# Patient Record
Sex: Female | Born: 1985 | Race: Black or African American | Hispanic: No | Marital: Married | Smoking: Never smoker
Health system: Southern US, Community
[De-identification: ages and names within clinical notes are randomized; demographics above are authoritative.]

## PROBLEM LIST (undated history)

## (undated) DIAGNOSIS — IMO0002 Reserved for concepts with insufficient information to code with codable children: Secondary | ICD-10-CM

## (undated) DIAGNOSIS — E119 Type 2 diabetes mellitus without complications: Secondary | ICD-10-CM

## (undated) DIAGNOSIS — Z9889 Other specified postprocedural states: Secondary | ICD-10-CM

## (undated) DIAGNOSIS — D649 Anemia, unspecified: Secondary | ICD-10-CM

## (undated) DIAGNOSIS — O24419 Gestational diabetes mellitus in pregnancy, unspecified control: Secondary | ICD-10-CM

## (undated) DIAGNOSIS — B999 Unspecified infectious disease: Secondary | ICD-10-CM

## (undated) DIAGNOSIS — R87619 Unspecified abnormal cytological findings in specimens from cervix uteri: Secondary | ICD-10-CM

## (undated) DIAGNOSIS — R112 Nausea with vomiting, unspecified: Secondary | ICD-10-CM

## (undated) DIAGNOSIS — R51 Headache: Secondary | ICD-10-CM

## (undated) DIAGNOSIS — S62109A Fracture of unspecified carpal bone, unspecified wrist, initial encounter for closed fracture: Secondary | ICD-10-CM

## (undated) DIAGNOSIS — S42309A Unspecified fracture of shaft of humerus, unspecified arm, initial encounter for closed fracture: Secondary | ICD-10-CM

## (undated) DIAGNOSIS — N63 Unspecified lump in unspecified breast: Secondary | ICD-10-CM

## (undated) HISTORY — PX: ABLATION: SHX5711

## (undated) HISTORY — DX: Other specified postprocedural states: Z98.890

## (undated) HISTORY — DX: Nausea with vomiting, unspecified: R11.2

## (undated) HISTORY — DX: Fracture of unspecified carpal bone, unspecified wrist, initial encounter for closed fracture: S62.109A

## (undated) HISTORY — DX: Gestational diabetes mellitus in pregnancy, unspecified control: O24.419

## (undated) HISTORY — DX: Unspecified infectious disease: B99.9

## (undated) HISTORY — DX: Unspecified fracture of shaft of humerus, unspecified arm, initial encounter for closed fracture: S42.309A

## (undated) HISTORY — DX: Anemia, unspecified: D64.9

## (undated) HISTORY — DX: Type 2 diabetes mellitus without complications: E11.9

## (undated) HISTORY — DX: Headache: R51

## (undated) HISTORY — DX: Reserved for concepts with insufficient information to code with codable children: IMO0002

## (undated) HISTORY — DX: Unspecified abnormal cytological findings in specimens from cervix uteri: R87.619

---

## 2005-04-02 ENCOUNTER — Emergency Department (HOSPITAL_COMMUNITY): Admission: EM | Admit: 2005-04-02 | Discharge: 2005-04-03 | Payer: Self-pay | Admitting: Emergency Medicine

## 2005-09-09 ENCOUNTER — Inpatient Hospital Stay (HOSPITAL_COMMUNITY): Admission: AD | Admit: 2005-09-09 | Discharge: 2005-09-09 | Payer: Self-pay | Admitting: Obstetrics and Gynecology

## 2005-10-16 ENCOUNTER — Inpatient Hospital Stay (HOSPITAL_COMMUNITY): Admission: AD | Admit: 2005-10-16 | Discharge: 2005-10-16 | Payer: Self-pay | Admitting: Obstetrics & Gynecology

## 2005-10-17 ENCOUNTER — Inpatient Hospital Stay (HOSPITAL_COMMUNITY): Admission: AD | Admit: 2005-10-17 | Discharge: 2005-10-19 | Payer: Self-pay | Admitting: Obstetrics & Gynecology

## 2006-12-26 ENCOUNTER — Inpatient Hospital Stay (HOSPITAL_COMMUNITY): Admission: AD | Admit: 2006-12-26 | Discharge: 2006-12-26 | Payer: Self-pay | Admitting: Obstetrics & Gynecology

## 2007-01-06 ENCOUNTER — Inpatient Hospital Stay (HOSPITAL_COMMUNITY): Admission: AD | Admit: 2007-01-06 | Discharge: 2007-01-06 | Payer: Self-pay | Admitting: Obstetrics and Gynecology

## 2007-01-08 ENCOUNTER — Inpatient Hospital Stay (HOSPITAL_COMMUNITY): Admission: AD | Admit: 2007-01-08 | Discharge: 2007-01-09 | Payer: Self-pay | Admitting: Obstetrics and Gynecology

## 2007-01-16 ENCOUNTER — Inpatient Hospital Stay (HOSPITAL_COMMUNITY): Admission: AD | Admit: 2007-01-16 | Discharge: 2007-01-18 | Payer: Self-pay | Admitting: Obstetrics and Gynecology

## 2007-10-11 ENCOUNTER — Emergency Department (HOSPITAL_COMMUNITY): Admission: EM | Admit: 2007-10-11 | Discharge: 2007-10-12 | Payer: Self-pay | Admitting: Emergency Medicine

## 2009-01-24 ENCOUNTER — Encounter: Admission: RE | Admit: 2009-01-24 | Discharge: 2009-01-24 | Payer: Self-pay | Admitting: Emergency Medicine

## 2009-04-26 HISTORY — PX: DILATION AND CURETTAGE OF UTERUS: SHX78

## 2009-08-10 IMAGING — CT CT PELVIS W/ CM
2 of 5 series · 17 of 46 positions shown, 19 images · IV contrast (agent unspecified)
Comparison: None available.

CT ABDOMEN

CLINICAL DATA: Right side abdominal pain and tenderness.

CT ABDOMEN AND PELVIS WITH CONTRAST
TECHNIQUE: Multidetector CT imaging of the abdomen and pelvis was
performed using the standard protocol following bolus
administration of intravenous contrast.
Contrast: 100 ml Kmnipaque-PVV.

[Series 2: abd_pel 5.0 b40f st · axial · 0.63mm/px · z∈[-424,-4]mm · 14 of 94 slices shown, 16 images]
[im 5/94  soft-tissue]
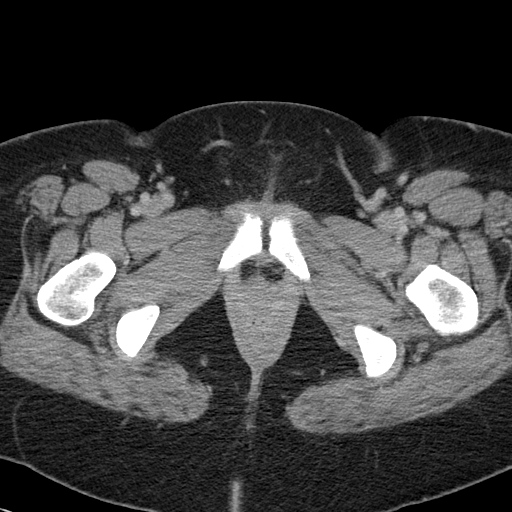
[im 5/94  bone]
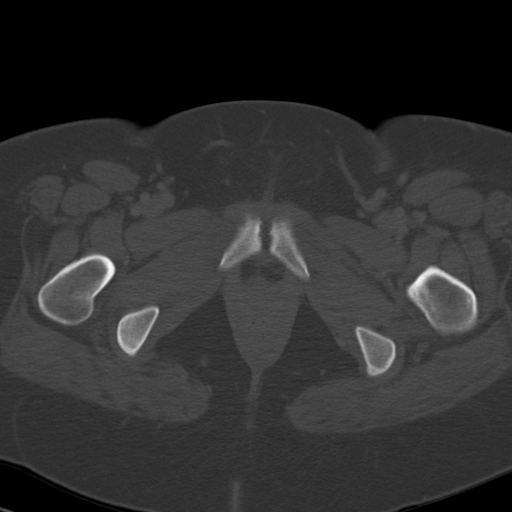
[im 10/94  soft-tissue]
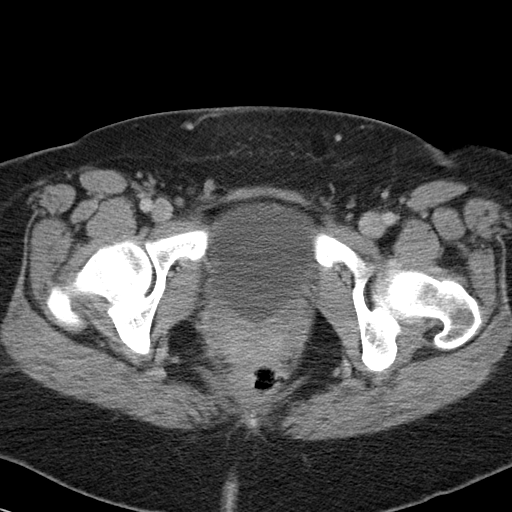
[im 20/94  soft-tissue]
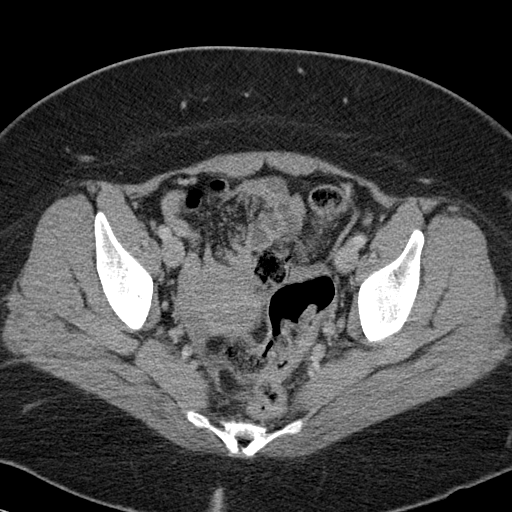
[im 25/94  soft-tissue]
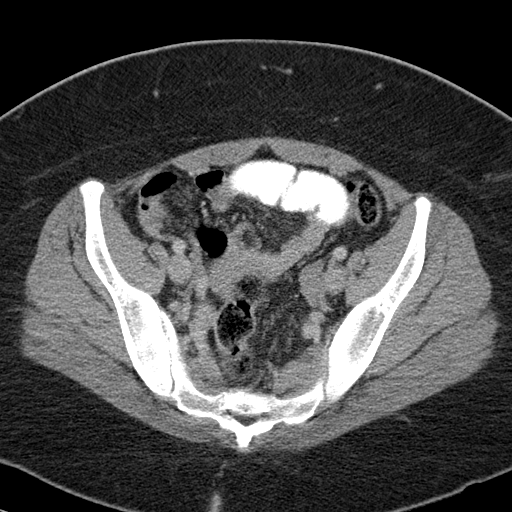
[im 30/94  soft-tissue]
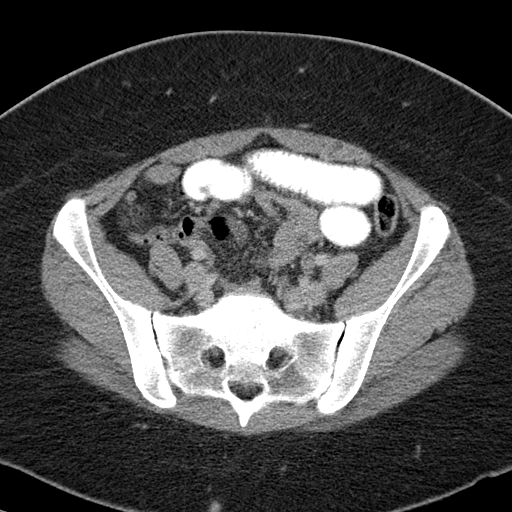
[im 40/94  soft-tissue]
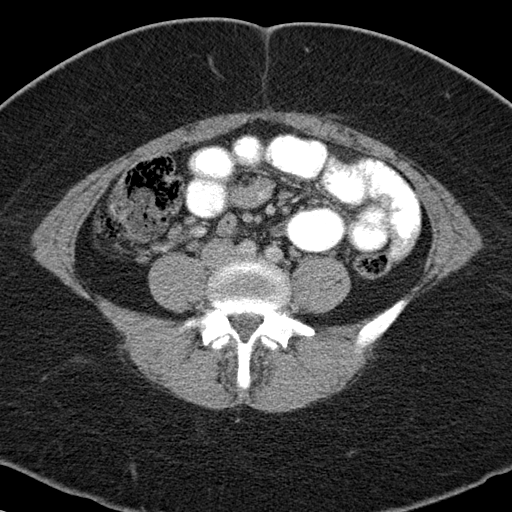
[im 45/94  soft-tissue]
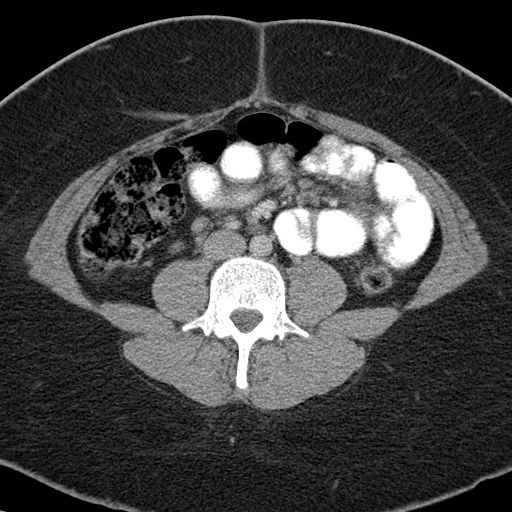
[im 49/94  soft-tissue]
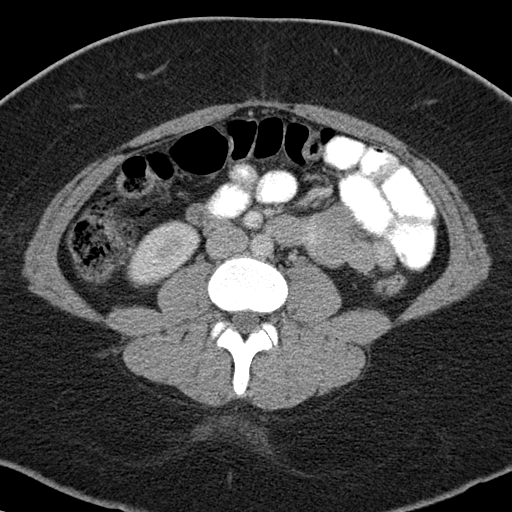
[im 54/94  soft-tissue]
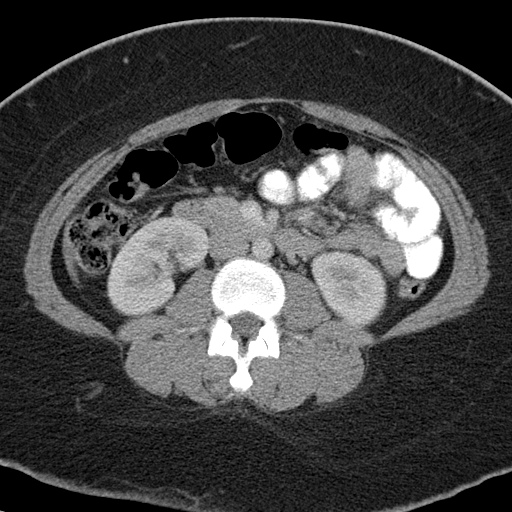
[im 54/94  bone]
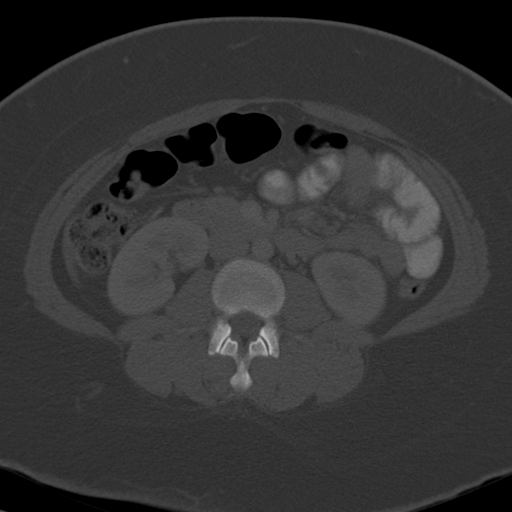
[im 64/94  soft-tissue]
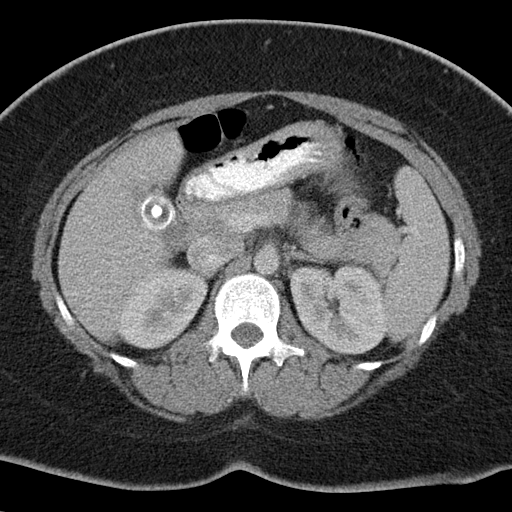
[im 69/94  soft-tissue]
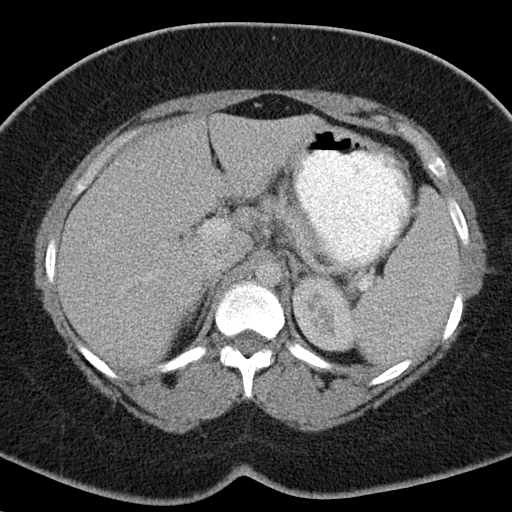
[im 74/94  soft-tissue]
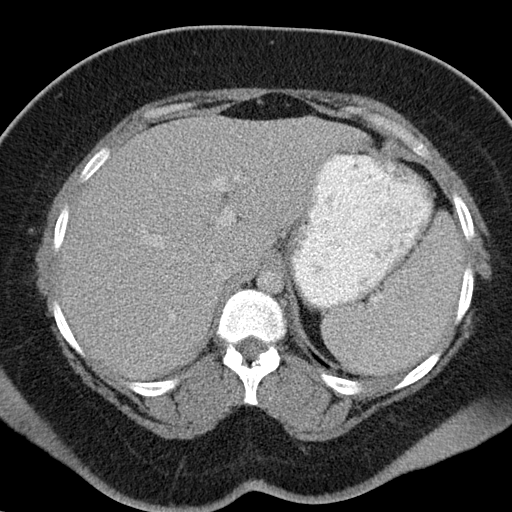
[im 84/94  soft-tissue]
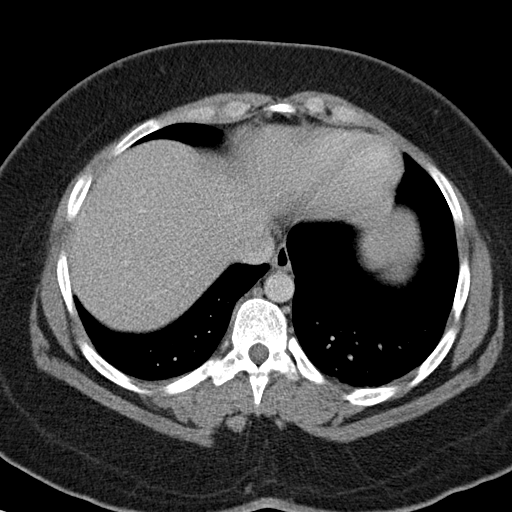
[im 89/94  soft-tissue]
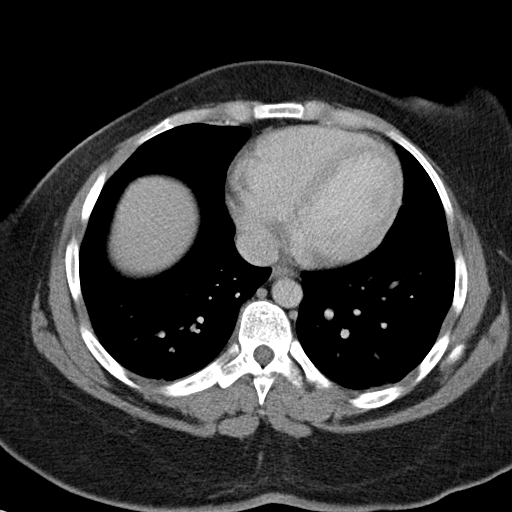

[Series 602: <mpr thick range> · coronal · 0.96mm/px · 3 of 71 slices shown]
[im 24/71  soft-tissue]
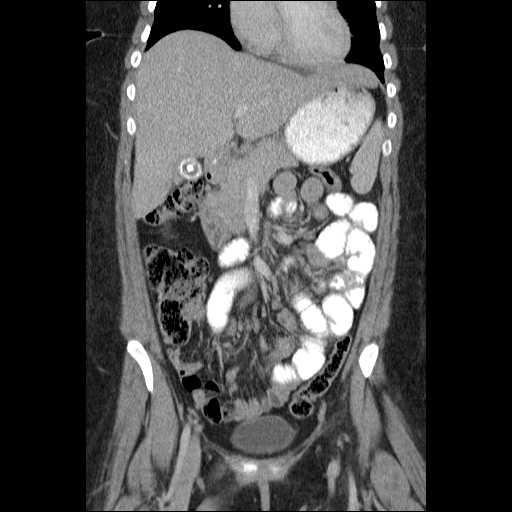
[im 32/71  soft-tissue]
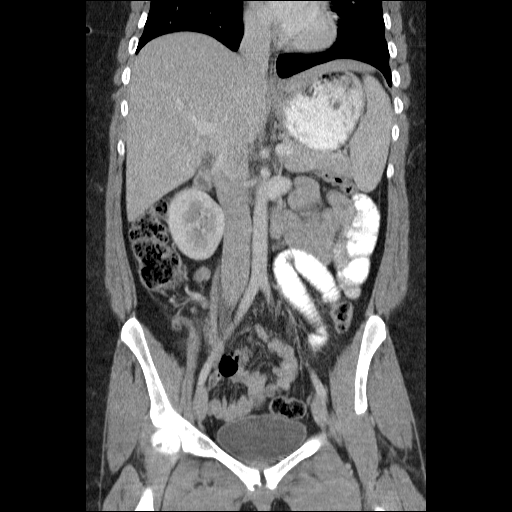
[im 39/71  soft-tissue]
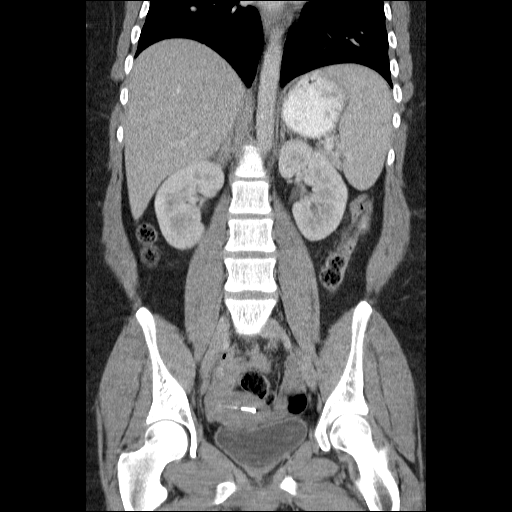

[17 of 46 positions shown; findings below may reference images not displayed]

FINDINGS: There is some mild dependent atelectatic change in the
lung bases.  No pleural or pericardial effusion.

Gallstones are noted with a large stone which measures approximate
2 cm in diameter seen.  Gallbladder is contracted.  There is no
pericholecystic fluid.  No intra or extrahepatic biliary ductal
dilatation is identified.  The liver, spleen, adrenal glands,
pancreas and kidneys all appear normal.  No abdominal
lymphadenopathy or fluid collection.  Stomach and small bowel
appear normal.  No focal bony abnormality.
IMPRESSION: 1.  No acute finding
2.  Gallstones.

CT PELVIS
FINDINGS: The appendix is visualized appears normal.  Small amount
of pelvic fluid likely represents physiologic change.  IUD is in
place and appears unremarkable.  Adnexa are unremarkable.  The
colon appears normal.  No lymphadenopathy.  Urinary bladder appears
normal.  No focal bony abnormality.
IMPRESSION: No acute finding in the pelvis.  Specifically, negative for
appendicitis.

## 2010-01-30 ENCOUNTER — Ambulatory Visit (HOSPITAL_COMMUNITY): Admission: RE | Admit: 2010-01-30 | Discharge: 2010-01-30 | Payer: Self-pay | Admitting: Obstetrics and Gynecology

## 2010-02-24 DEATH — deceased

## 2010-07-09 LAB — CBC
Hemoglobin: 12.5 g/dL (ref 12.0–15.0)
Platelets: 306 10*3/uL (ref 150–400)
RBC: 4.66 MIL/uL (ref 3.87–5.11)
WBC: 7.3 10*3/uL (ref 4.0–10.5)

## 2010-07-09 LAB — TYPE AND SCREEN
ABO/RH(D): A POS
Antibody Screen: NEGATIVE

## 2010-07-09 LAB — ABO/RH: ABO/RH(D): A POS

## 2011-01-21 LAB — WET PREP, GENITAL: Clue Cells Wet Prep HPF POC: NONE SEEN

## 2011-01-21 LAB — POCT I-STAT, CHEM 8
BUN: 11
Calcium, Ion: 1.18
Chloride: 107
Creatinine, Ser: 0.6
Glucose, Bld: 114 — ABNORMAL HIGH
HCT: 41
Hemoglobin: 13.9
Potassium: 3.8
Sodium: 138
TCO2: 20

## 2011-01-21 LAB — URINALYSIS, ROUTINE W REFLEX MICROSCOPIC
Bilirubin Urine: NEGATIVE
Ketones, ur: NEGATIVE
Nitrite: NEGATIVE
Protein, ur: NEGATIVE
Urobilinogen, UA: 1

## 2011-01-21 LAB — CBC
HCT: 38.5
Hemoglobin: 12.5
MCV: 76.6 — ABNORMAL LOW
RBC: 5.02
WBC: 9.8

## 2011-01-21 LAB — DIFFERENTIAL
Eosinophils Absolute: 0.2
Lymphs Abs: 2.5
Monocytes Absolute: 0.6
Monocytes Relative: 6
Neutrophils Relative %: 66

## 2011-01-21 LAB — RPR: RPR Ser Ql: NONREACTIVE

## 2011-01-21 LAB — GC/CHLAMYDIA PROBE AMP, GENITAL: Chlamydia, DNA Probe: NEGATIVE

## 2011-01-21 LAB — URINE MICROSCOPIC-ADD ON

## 2011-02-04 LAB — CBC
Hemoglobin: 11.2 — ABNORMAL LOW
Platelets: 252
RBC: 4.19
RDW: 16 — ABNORMAL HIGH
WBC: 13.7 — ABNORMAL HIGH

## 2011-02-05 LAB — URINALYSIS, ROUTINE W REFLEX MICROSCOPIC
Glucose, UA: NEGATIVE
Specific Gravity, Urine: 1.01
pH: 7

## 2011-02-05 LAB — URINE MICROSCOPIC-ADD ON

## 2011-04-16 ENCOUNTER — Ambulatory Visit (INDEPENDENT_AMBULATORY_CARE_PROVIDER_SITE_OTHER): Payer: 59

## 2011-04-16 DIAGNOSIS — R05 Cough: Secondary | ICD-10-CM

## 2011-04-16 DIAGNOSIS — R509 Fever, unspecified: Secondary | ICD-10-CM

## 2011-04-16 DIAGNOSIS — R059 Cough, unspecified: Secondary | ICD-10-CM

## 2011-04-16 DIAGNOSIS — R5381 Other malaise: Secondary | ICD-10-CM

## 2011-04-16 DIAGNOSIS — J111 Influenza due to unidentified influenza virus with other respiratory manifestations: Secondary | ICD-10-CM

## 2011-05-21 ENCOUNTER — Encounter: Payer: Self-pay | Admitting: *Deleted

## 2011-05-21 DIAGNOSIS — J309 Allergic rhinitis, unspecified: Secondary | ICD-10-CM | POA: Insufficient documentation

## 2011-05-25 ENCOUNTER — Other Ambulatory Visit: Payer: Self-pay | Admitting: Family Medicine

## 2011-05-25 ENCOUNTER — Encounter: Payer: Self-pay | Admitting: Family Medicine

## 2011-05-25 ENCOUNTER — Ambulatory Visit (INDEPENDENT_AMBULATORY_CARE_PROVIDER_SITE_OTHER): Payer: 59 | Admitting: Family Medicine

## 2011-05-25 DIAGNOSIS — Z0000 Encounter for general adult medical examination without abnormal findings: Secondary | ICD-10-CM

## 2011-05-25 DIAGNOSIS — N72 Inflammatory disease of cervix uteri: Secondary | ICD-10-CM

## 2011-05-25 DIAGNOSIS — E669 Obesity, unspecified: Secondary | ICD-10-CM

## 2011-05-25 DIAGNOSIS — Z01419 Encounter for gynecological examination (general) (routine) without abnormal findings: Secondary | ICD-10-CM

## 2011-05-25 LAB — POCT URINALYSIS DIPSTICK
Bilirubin, UA: NEGATIVE
Glucose, UA: NEGATIVE
Ketones, UA: NEGATIVE
Leukocytes, UA: NEGATIVE
Protein, UA: NEGATIVE
Spec Grav, UA: 1.025
pH, UA: 6

## 2011-05-25 LAB — POCT WET PREP WITH KOH
KOH Prep POC: POSITIVE
Yeast Wet Prep HPF POC: NEGATIVE

## 2011-05-25 MED ORDER — FLUCONAZOLE 150 MG PO TABS: 150.0000 mg | ORAL_TABLET | Freq: Once | ORAL | Status: AC

## 2011-05-25 MED ORDER — CLINDAMYCIN HCL 300 MG PO CAPS: 300.0000 mg | ORAL_CAPSULE | Freq: Two times a day (BID) | ORAL | Status: AC

## 2011-05-25 MED ORDER — NORGESTIM-ETH ESTRAD TRIPHASIC 0.18/0.215/0.25 MG-35 MCG PO TABS: 1.0000 | ORAL_TABLET | Freq: Every day | ORAL | Status: DC

## 2011-05-25 NOTE — Progress Notes (Signed)
Addended by: Dow Adolph B on: 05/25/2011 06:13 PM   Modules accepted: Orders

## 2011-05-25 NOTE — Progress Notes (Signed)
  Subjective:    Patient ID: Amber Morales, female    DOB: December 19, 1985, 26 y.o.   MRN: [id number]  HPI This 26 year-old female is here for annual physical with PAP; last performed in October of 2011 at [facility]- Normal. S/P NSVD x 2. Currently wants to discuss Weight Reduction; she has Tried 3 plans including [facility] and Low Carb with some success. Lost 25 lbs. On Atkins. Used to exercise but quit because of lack of time. Recently treated for Flu and has a history of Sinus infections. Has found that Flonase causes a headache. In the past, OTC Zyrtec works well.   Review of Systems  Constitutional: Negative.   HENT: Positive for congestion, sore throat and sinus pressure.   Eyes: Negative.   Respiratory: Negative.   Cardiovascular: Negative.   Gastrointestinal: Negative.   Genitourinary: Negative.   Musculoskeletal: Positive for arthralgias.       Left index finger is only joint that hurts.  Skin: Negative.   Neurological: Negative.   Hematological: Negative.   Psychiatric/Behavioral: Negative.        Objective:   Physical Exam  Constitutional: She is oriented to person, place, and time. She appears well-developed and well-nourished.       Patient is obese .  HENT:  Head: Normocephalic and atraumatic.  Right Ear: External ear normal.  Left Ear: External ear normal.  Mouth/Throat: Oropharynx is clear and moist. No oropharyngeal exudate.  Eyes: Conjunctivae and EOM are normal. Pupils are equal, round, and reactive to light. No scleral icterus.  Neck: Normal range of motion.  Cardiovascular: Normal rate, regular rhythm and normal heart sounds.   No murmur heard. Pulmonary/Chest: Effort normal and breath sounds normal. No respiratory distress.  Abdominal: Soft. Bowel sounds are normal. She exhibits no mass. There is no tenderness. There is no guarding.  Genitourinary: Uterus normal. Vaginal discharge found.  Musculoskeletal: Normal range of motion. She  exhibits tenderness.       Left index finger- tender DIP without redness or swelling  Neurological: She is alert and oriented to person, place, and time. She has normal reflexes. No cranial nerve deficit.  Skin: Skin is warm and dry.  Psychiatric: She has a normal mood and affect.          Assessment & Plan:

## 2011-05-25 NOTE — Patient Instructions (Addendum)
Calorie Counting Diet A calorie counting diet requires you to eat the number of calories that are right for you in a day. Calories are the measurement of how much energy you get from the food you eat. Eating the right amount of calories is important for staying at a healthy weight. If you eat too many calories, your body will store them as fat and you may gain weight. If you eat too few calories, you may lose weight. Counting the number of calories you eat during a day will help you know if you are eating the right amount. A Registered Dietitian can determine how many calories you need in a day. The amount of calories needed varies from person to person. If your goal is to lose weight, you will need to eat fewer calories. Losing weight can benefit you if you are overweight or have health problems such as heart disease, high blood pressure, or diabetes. If your goal is to gain weight, you will need to eat more calories. Gaining weight may be necessary if you have a certain health problem that causes your body to need more energy. TIPS Whether you are increasing or decreasing the number of calories you eat during a day, it may be hard to get used to changes in what you eat and drink. The following are tips to help you keep track of the number of calories you eat.  Measure foods at home with measuring cups. This helps you know the amount of food and number of calories you are eating.   Restaurants often serve food in amounts that are larger than 1 serving. While eating out, estimate how many servings of a food you are given. For example, a serving of cooked rice is  cup or about the size of half of a fist. Knowing serving sizes will help you be aware of how much food you are eating at restaurants.   Ask for smaller portion sizes or child-size portions at restaurants.   Plan to eat half of a meal at a restaurant. Take the rest home or share the other half with a friend.   Read the Nutrition Facts panel on  food labels for calorie content and serving size. You can find out how many servings are in a package, the size of a serving, and the number of calories each serving has.   For example, a package might contain 3 cookies. The Nutrition Facts panel on that package says that 1 serving is 1 cookie. Below that, it will say there are 3 servings in the container. The calories section of the Nutrition Facts label says there are 90 calories. This means there are 90 calories in 1 cookie (1 serving). If you eat 1 cookie you have eaten 90 calories. If you eat all 3 cookies, you have eaten 270 calories (3 servings x 90 calories = 270 calories).  The list below tells you how big or small some common portion sizes are.  1 oz.........4 stacked dice.   3 oz.........Deck of cards.   1 tsp........Tip of little finger.   1 tbs........Thumb.   2 tbs........Golf ball.    cup.......Half of a fist.   1 cup........A fist.  KEEP A FOOD LOG Write down every food item you eat, the amount you eat, and the number of calories in each food you eat during the day. At the end of the day, you can add up the total number of calories you have eaten. It may help to keep a   list like the one below. Find out the calorie information by reading the Nutrition Facts panel on food labels. Breakfast  Bran cereal (1 cup, 110 calories).   Fat-free milk ( cup, 45 calories).  Snack  Apple (1 medium, 80 calories).  Lunch  Spinach (1 cup, 20 calories).   Tomato ( medium, 20 calories).   Chicken breast strips (3 oz, 165 calories).   Shredded cheddar cheese ( cup, 110 calories).   Light [place] dressing (2 tbs, 60 calories).   Whole-wheat bread (1 slice, 80 calories).   Tub margarine (1 tsp, 35 calories).   Vegetable soup (1 cup, 160 calories).  Dinner  Pork chop (3 oz, 190 calories).   Brown rice (1 cup, 215 calories).   Steamed broccoli ( cup, 20 calories).   Strawberries (1  cup, 65 calories).   Whipped  cream (1 tbs, 50 calories).  Daily Calorie Total: 1425 Document Released: 04/12/2005 Document Revised: 12/23/2010 Document Reviewed: 10/07/2006 [facility] Patient Information 2012 [place], [place].   Bacterial Vaginosis Bacterial vaginosis (BV) is a vaginal infection where the normal balance of bacteria in the vagina is disrupted. The normal balance is then replaced by an overgrowth of certain bacteria. There are several different kinds of bacteria that can cause BV. BV is the most common vaginal infection in women of childbearing age. CAUSES   The cause of BV is not fully understood. BV develops when there is an increase or imbalance of harmful bacteria.   Some activities or behaviors can upset the normal balance of bacteria in the vagina and put women at increased risk including:   Having a new sex partner or multiple sex partners.   Douching.   Using an intrauterine device (IUD) for contraception.   It is not clear what role sexual activity plays in the development of BV. However, women that have never had sexual intercourse are rarely infected with BV.  Women do not get BV from toilet seats, bedding, swimming pools or from touching objects around them.  SYMPTOMS   Grey vaginal discharge.   A fish-like odor with discharge, especially after sexual intercourse.   Itching or burning of the vagina and vulva.   Burning or pain with urination.   Some women have no signs or symptoms at all.  DIAGNOSIS  Your caregiver must examine the vagina for signs of BV. Your caregiver will perform lab tests and look at the sample of vaginal fluid through a microscope. They will look for bacteria and abnormal cells (clue cells), a pH test higher than 4.5, and a positive amine test all associated with BV.  RISKS AND COMPLICATIONS   Pelvic inflammatory disease (PID).   Infections following gynecology surgery.   Developing HIV.   Developing herpes virus.  TREATMENT  Sometimes BV will clear up  without treatment. However, all women with symptoms of BV should be treated to avoid complications, especially if gynecology surgery is planned. Female partners generally do not need to be treated. However, BV may spread between female sex partners so treatment is helpful in preventing a recurrence of BV.   BV may be treated with antibiotics. The antibiotics come in either pill or vaginal cream forms. Either can be used with nonpregnant or pregnant women, but the recommended dosages differ. These antibiotics are not harmful to the baby.   BV can recur after treatment. If this happens, a second round of antibiotics will often be prescribed.   Treatment is important for pregnant women. If not treated, BV can cause  a premature delivery, especially for a pregnant woman who had a premature birth in the past. All pregnant women who have symptoms of BV should be checked and treated.   For chronic reoccurrence of BV, treatment with a type of prescribed gel vaginally twice a week is helpful.  HOME CARE INSTRUCTIONS   Finish all medication as directed by your caregiver.   Do not have sex until treatment is completed.   Tell your sexual partner that you have a vaginal infection. They should see their caregiver and be treated if they have problems, such as a mild rash or itching.   Practice safe sex. Use condoms. Only have 1 sex partner.  PREVENTION  Basic prevention steps can help reduce the risk of upsetting the natural balance of bacteria in the vagina and developing BV:  Do not have sexual intercourse (be abstinent).   Do not douche.   Use all of the medicine prescribed for treatment of BV, even if the signs and symptoms go away.   Tell your sex partner if you have BV. That way, they can be treated, if needed, to prevent reoccurrence.  SEEK MEDICAL CARE IF:   Your symptoms are not improving after 3 days of treatment.   You have increased discharge, pain, or fever.  MAKE SURE YOU:    Understand these instructions.   Will watch your condition.   Will get help right away if you are not doing well or get worse.  FOR MORE INFORMATION  Division of STD Prevention (DSTDP), Centers for Disease Control and Prevention: [employer] American Social Health Association (ASHA): www.ashastd.org  Document Released: 04/12/2005 Document Revised: 12/23/2010 Document Reviewed: 10/03/2008 [facility] Patient Information 2012 [place], [place].

## 2011-05-27 LAB — PAP IG, CT-NG, RFX HPV ASCU
Chlamydia Probe Amp: NEGATIVE
GC Probe Amp: NEGATIVE

## 2011-05-28 NOTE — Progress Notes (Signed)
Quick Note:  Notify pt of Normal results. ______ 

## 2011-08-25 ENCOUNTER — Ambulatory Visit: Payer: 59 | Admitting: Family Medicine

## 2012-01-06 ENCOUNTER — Ambulatory Visit (INDEPENDENT_AMBULATORY_CARE_PROVIDER_SITE_OTHER): Payer: 59 | Admitting: Physician Assistant

## 2012-01-06 VITALS — BP 133/75 | HR 82 | Temp 99.1°F | Resp 16 | Ht 66.0 in | Wt 268.0 lb

## 2012-01-06 DIAGNOSIS — S91309A Unspecified open wound, unspecified foot, initial encounter: Secondary | ICD-10-CM

## 2012-01-06 DIAGNOSIS — Z23 Encounter for immunization: Secondary | ICD-10-CM

## 2012-01-06 DIAGNOSIS — S91319A Laceration without foreign body, unspecified foot, initial encounter: Secondary | ICD-10-CM

## 2012-01-06 NOTE — Progress Notes (Signed)
  Subjective:    Patient ID: Amber Morales, female    DOB: October 08, 1985, 26 y.o.   MRN: [id number]  HPI66 yr old female presents with wound to L foot after getting in her vehicle barefoot yesterday and scraping the top of her foot with the pedals.  She hasn't had a tetanus shot since she was 16.   Review of Systems  All other systems reviewed and are negative.       Objective:   Physical Exam  Nursing note and vitals reviewed. Constitutional: She appears well-developed and well-nourished.  Cardiovascular: Normal rate, regular rhythm and normal heart sounds.   Pulmonary/Chest: Effort normal and breath sounds normal.  Skin: Skin is warm and dry. Laceration noted.          Assessment & Plan:  Wound-cleansed with peroxide and steri-stripped. Wound care discussed.  Watch for infection-tdap updated.

## 2012-03-06 ENCOUNTER — Ambulatory Visit (INDEPENDENT_AMBULATORY_CARE_PROVIDER_SITE_OTHER): Payer: 59 | Admitting: Emergency Medicine

## 2012-03-06 ENCOUNTER — Ambulatory Visit: Payer: 59

## 2012-03-06 VITALS — BP 144/78 | HR 90 | Temp 98.2°F | Resp 17 | Ht 65.0 in | Wt 268.0 lb

## 2012-03-06 DIAGNOSIS — M79673 Pain in unspecified foot: Secondary | ICD-10-CM

## 2012-03-06 DIAGNOSIS — M79609 Pain in unspecified limb: Secondary | ICD-10-CM

## 2012-03-06 DIAGNOSIS — J029 Acute pharyngitis, unspecified: Secondary | ICD-10-CM

## 2012-03-06 LAB — POCT RAPID STREP A (OFFICE): Rapid Strep A Screen: NEGATIVE

## 2012-03-06 NOTE — Progress Notes (Signed)
[facility] [address], [place] [address] [phone]  Date:  03/06/2012   Name:  Amber Morales   DOB:  [birth year]   MRN:  [id number]  PCP:  No primary provider on file.    Chief Complaint: Sore Throat and Toe Injury   History of Present Illness:  Amber Morales is a 26 y.o. very pleasant female patient who presents with the following:  3 day history of sore throat and nasal congestion and mucoid nasal discharge.  Pain in throat is predominately in the left side.  Says it is "not like strep". Has fever of 101.  No cough, wheezing or shortness of breath. No rash or headache.  Kicked a Licensed conveyancer with her foot 3 weeks ago and hit her left small toe. Has persistent pain as she continues to reinjure the toe.  Patient Active Problem List  Diagnosis  . Allergic rhinitis    Past Medical History  Diagnosis Date  . Allergic rhinitis     No past surgical history on file.  History  Substance Use Topics  . Smoking status: Never Smoker   . Smokeless tobacco: Not on file  . Alcohol Use: Not on file    Family History  Problem Relation Age of Onset  . Hyperlipidemia Mother   . Asthma Father   . Asthma Sister   . Asthma Maternal Grandmother   . Stroke Maternal Grandfather   . Hypertension Paternal Grandmother   . Heart disease Paternal Grandmother   . Arthritis Maternal Aunt     2 maternal aunts - Rheumatoid Arthritis    Allergies  Allergen Reactions  . Ibuprofen Hives    Medication list has been reviewed and updated.  Current Outpatient Prescriptions on File Prior to Visit  Medication Sig Dispense Refill  . Norgestimate-Ethinyl Estradiol Triphasic 0.18/0.215/0.25 MG-35 MCG tablet Take 1 tablet by mouth daily.  1 Package  11    Review of Systems:  As per HPI, otherwise negative.    Physical Examination: Filed Vitals:   03/06/12 1438  BP: 144/78  Pulse: 90  Temp: 98.2 F (36.8 C)  Resp: 17   Filed Vitals:   03/06/12 1438    Height: 5\' 5"  (1.651 m)  Weight: 268 lb (121.564 kg)   Body mass index is 44.60 kg/(m^2). Ideal Body Weight: Weight in (lb) to have BMI = 25: 149.9   GEN: WDWN, NAD, Non-toxic, A & O x 3 HEENT: Atraumatic, Normocephalic. Neck supple. No masses, No LAD.  Oropharynx erythematous Ears and Nose: No external deformity. CV: RRR, No M/G/R. No JVD. No thrill. No extra heart sounds. PULM: CTA B, no wheezes, crackles, rhonchi. No retractions. No resp. distress. No accessory muscle use. ABD: S, NT, ND, +BS. No rebound. No HSM. EXTR: No c/c/e NEURO Normal gait.  PSYCH: Normally interactive. Conversant. Not depressed or anxious appearing.  Calm demeanor.  FOOT:  Tender swollen left small toe  Assessment and Plan: Sprain left fifth toe Buddy tape  Pharyngitis Symptomatic treatment  Carmelina Dane, MD  Results for orders placed in visit on 03/06/12  POCT RAPID STREP A (OFFICE)      Component Value Range   Rapid Strep A Screen Negative  Negative    [facility] reading (PRIMARY) by  Dr. Dareen Piano.  negative.

## 2012-03-17 NOTE — Progress Notes (Signed)
Reviewed and agree.

## 2012-04-14 ENCOUNTER — Ambulatory Visit (INDEPENDENT_AMBULATORY_CARE_PROVIDER_SITE_OTHER): Payer: 59 | Admitting: Obstetrics and Gynecology

## 2012-04-14 DIAGNOSIS — Z3200 Encounter for pregnancy test, result unknown: Secondary | ICD-10-CM

## 2012-04-14 DIAGNOSIS — Z331 Pregnant state, incidental: Secondary | ICD-10-CM

## 2012-04-14 DIAGNOSIS — O26849 Uterine size-date discrepancy, unspecified trimester: Secondary | ICD-10-CM

## 2012-04-14 LAB — POCT URINALYSIS DIPSTICK
Glucose, UA: NEGATIVE
Ketones, UA: NEGATIVE
Spec Grav, UA: 1.02

## 2012-04-14 NOTE — Progress Notes (Signed)
NOB Interview.  Reviewed pt's sx of shortness of breath,fatigue and cramping with CHS. . Pt to call if cramping increased or with no improvement after increasing water or with any bleeding. Fatigue and shortness of breath may be due to early pregnancy and/or anemia. Per CHS, advised to change position slowly  to avoid dizziness. To call with chest pain or any concerns. Pt verbalizes comprehension.

## 2012-04-16 LAB — CULTURE, OB URINE: Colony Count: NO GROWTH

## 2012-04-17 ENCOUNTER — Telehealth: Payer: Self-pay | Admitting: Obstetrics and Gynecology

## 2012-04-17 LAB — PRENATAL PANEL VII
Basophils Absolute: 0 10*3/uL (ref 0.0–0.1)
Basophils Relative: 0 % (ref 0–1)
Eosinophils Absolute: 0.1 10*3/uL (ref 0.0–0.7)
HIV: NONREACTIVE
Hepatitis B Surface Ag: NEGATIVE
MCH: 26.4 pg (ref 26.0–34.0)
MCHC: 33 g/dL (ref 30.0–36.0)
Monocytes Relative: 7 % (ref 3–12)
Neutro Abs: 6 10*3/uL (ref 1.7–7.7)
Neutrophils Relative %: 66 % (ref 43–77)
RDW: 14.2 % (ref 11.5–15.5)
Rh Type: POSITIVE

## 2012-04-17 NOTE — Telephone Encounter (Signed)
Pt states that she is having pain in bottom of stomach. No bleeding no fever. Cramping lasting like 1 hr 15 min. Hasn't taken any tylenol. Had 4 glasses of water today. Advised pt to increase water intake also take some tylenol. If pain doesn't ease or starts having bleeding or fever contact office.  Darien Ramus, CMA

## 2012-04-18 LAB — HEMOGLOBINOPATHY EVALUATION
Hgb A2 Quant: 2.7 % (ref 2.2–3.2)
Hgb A: 97.3 % (ref 96.8–97.8)
Hgb F Quant: 0 % (ref 0.0–2.0)

## 2012-04-26 NOTE — L&D Delivery Note (Signed)
Delivery Note At 3:06 PM a viable female, "Amber Morales", was delivered via Vaginal, Spontaneous Delivery (Presentation ROA: ;  ).  APGAR: , ; weight .   Placenta status: spontaneous, intact, .  Cord: 3 vessels with the following complications: None. Cord draped around shoulders.  Cord pH: NA  Anesthesia: Epidural  Episiotomy:  Lacerations:  Suture Repair: None Est. Blood Loss (mL):  300 cc Cervix examined to verify no laceration, due to small amount of increased bleeding just after delivery. Cytotech 800 mcg per rectum for prophylaxis. Uterine tone WNL . No lacerations noted.  Mom to postpartum.  Baby to skin to skin. Placenta to path due to growth lag. Patient signed tubal papers 10/11/12--will do BTL later during 6 weeks.. Plan FBS in am.  Nigel Bridgeman 10/27/2012, 3:34 PM

## 2012-04-28 ENCOUNTER — Encounter: Payer: Self-pay | Admitting: Obstetrics and Gynecology

## 2012-04-28 ENCOUNTER — Ambulatory Visit (INDEPENDENT_AMBULATORY_CARE_PROVIDER_SITE_OTHER): Payer: 59 | Admitting: Obstetrics and Gynecology

## 2012-04-28 ENCOUNTER — Ambulatory Visit (INDEPENDENT_AMBULATORY_CARE_PROVIDER_SITE_OTHER): Payer: 59

## 2012-04-28 VITALS — BP 104/58 | Wt 267.0 lb

## 2012-04-28 DIAGNOSIS — O09219 Supervision of pregnancy with history of pre-term labor, unspecified trimester: Secondary | ICD-10-CM

## 2012-04-28 DIAGNOSIS — O26849 Uterine size-date discrepancy, unspecified trimester: Secondary | ICD-10-CM

## 2012-04-28 DIAGNOSIS — Z331 Pregnant state, incidental: Secondary | ICD-10-CM

## 2012-04-28 DIAGNOSIS — Z23 Encounter for immunization: Secondary | ICD-10-CM

## 2012-04-28 HISTORY — DX: Pregnant state, incidental: Z33.1

## 2012-04-28 LAB — POCT WET PREP (WET MOUNT): Clue Cells Wet Prep Whiff POC: NEGATIVE

## 2012-04-28 LAB — POCT URINALYSIS DIPSTICK
Blood, UA: NEGATIVE
Glucose, UA: NEGATIVE
Spec Grav, UA: <=1.005
Urobilinogen, UA: NEGATIVE

## 2012-04-28 LAB — US OB COMP LESS 14 WKS

## 2012-04-28 NOTE — Progress Notes (Signed)
Pt is here today for her NOB work-up. Pt stated been having pain,cramping, nausea. Last pap 05/25/2011 wnl. Pt stated no other issues today.  Ultrasound:  EGA: 11 weeks + 6days, S=D Suggest EDD to be changed by todays ultrasound.

## 2012-04-29 DIAGNOSIS — O09219 Supervision of pregnancy with history of pre-term labor, unspecified trimester: Secondary | ICD-10-CM

## 2012-04-29 HISTORY — DX: Supervision of pregnancy with history of pre-term labor, unspecified trimester: O09.219

## 2012-04-29 NOTE — Progress Notes (Signed)
Patient ID: Amber Morales, female   DOB: 1985/06/30, 27 y.o.   MRN: [id number] Amber Morales is a 27 y.o. female presenting for new ob visit. [redacted]w[redacted]d No birth control at time of conception Some nausea no vomiting discussed diet Birth control at time of conception  Taking PNV LMP uncertain  [place] today 49 6/7week will use for EDC OB hx SVD x 2 at term uncomplicated with exception hx of cervical change at 32 weeks with PTL  OB History    Grav Para Term Preterm Abortions TAB SAB Ect Mult Living   4 2 2  1  1   2      Obstetric Comments   2008 PTL AT 64 WEEKS; ON MEDS     Past Medical History  Diagnosis Date  . Allergic rhinitis   . PONV (postoperative nausea and vomiting)   . Abnormal Pap smear     LAST PAPP 04/2011  . Preterm labor 2008  . Infection     UTI X 1  . Anemia     CHRONIC  . Headache X 10 YEARS    MIGRAINES; OTC MED  . Fracture of wrist CHILDHOOD  . Fracture of arm CHILDHOOD   Past Surgical History  Procedure Date  . Dilation and curettage of uterus 2011   Family History: family history includes Arthritis in her maternal aunt; Asthma in her father, maternal grandmother, and sister; Birth defects in her cousin; Heart disease in her paternal grandmother; Hyperlipidemia in her mother; Hypertension in her paternal grandmother; Other in her mother; and Stroke in her maternal grandfather. Social History:  reports that she has never smoked. She does not have any smokeless tobacco history on file. She reports that she does not use illicit drugs. Her alcohol history not on file.  @ROS @    Blood pressure 104/58, weight 267 lb (121.11 kg), last menstrual period 02/09/2012. Physical exam: Calm, no distress, HEENT wnl lungs clear bilaterally, breasts bilaterally no masses, dimpling, or drainage, AP RRR, abd soft, gravid, nt, bowel sounds active, abdomen nontender,  Normal hair distrubition mons pubis,  EGBUS WNL, sterile speculum exam,  vagina pink, moist normal rugae,  cerix LTC,  no cervical motion tenderness, No adnexal masses or tenderness Uterus size 13 Scant white discharge... Bilaterally DTR +1 no clonus No edema to lower extremities fhts per [place] today Prenatal labs: ABO, Rh: A/POS/-- (12/20 1142) Antibody: NEG (12/20 1142) Rubella:  Immune RPR: NON REAC (12/20 1142)  HBsAg: NEGATIVE (12/20 1142)  HIV: NON REACTIVE (12/20 1142)    Assessment/Plan: [redacted]w[redacted]d GC/CHL WET PREP neg PAP  due in Feb hx of normal last pap ULTRASOUND today for 1st trimester screen Fm hx of spina bifida 4 mg folic acid daily discussed. HX of ptl at 32 week delivery at term, discussed P17 starting at 16 weeks will discuss with MD start at  Collaboration with Dr. Stefano Gaul. Xiong Haidar 04/29/2012, 6:20 PM Lavera Guise, CNM Late entry

## 2012-05-10 ENCOUNTER — Other Ambulatory Visit: Payer: Self-pay | Admitting: Obstetrics and Gynecology

## 2012-05-10 MED ORDER — CONCEPT OB 130-92.4-1 MG PO CAPS: 1.0000 | ORAL_CAPSULE | Freq: Every day | ORAL | Status: DC

## 2012-05-10 NOTE — Telephone Encounter (Signed)
Spoke with pt rgd msg informed rx sent to pharm

## 2012-05-26 ENCOUNTER — Ambulatory Visit: Payer: 59 | Admitting: Obstetrics and Gynecology

## 2012-05-26 ENCOUNTER — Other Ambulatory Visit: Payer: 59

## 2012-05-26 ENCOUNTER — Encounter: Payer: Self-pay | Admitting: Obstetrics and Gynecology

## 2012-05-26 VITALS — BP 108/68 | Wt 268.0 lb

## 2012-05-26 DIAGNOSIS — Z331 Pregnant state, incidental: Secondary | ICD-10-CM

## 2012-05-26 DIAGNOSIS — Z3480 Encounter for supervision of other normal pregnancy, unspecified trimester: Secondary | ICD-10-CM

## 2012-05-26 MED ORDER — HYDROXYPROGESTERONE CAPROATE 250 MG/ML IM OIL
250.0000 mg | TOPICAL_OIL | Freq: Once | INTRAMUSCULAR | Status: AC
  Administered 2012-05-26: 250 mg via INTRAMUSCULAR

## 2012-05-26 NOTE — Progress Notes (Signed)
[redacted]w[redacted]d C/o inc in mucus discharge H/o pt ctxs but actually delivered at term 17P qwk ROB in 3wks and anatomy scan Unable to find 1st tri screen - pt says she did not have blood drawn same day she had early u/s Will do quad screen today

## 2012-05-30 LAB — AFP, QUAD SCREEN
Curr Gest Age: 16.6 wks.days
Down Syndrome Scr Risk Est: 1:7950 {titer}
HCG, Total: 14056 m[IU]/mL
INH: 113.5 pg/mL
Interpretation-AFP: NEGATIVE
MoM for INH: 0.85
MoM for hCG: 0.93
Open Spina bifida: NEGATIVE
Osb Risk: 1:49200 {titer}
Tri 18 Scr Risk Est: NEGATIVE
uE3 Mom: 1.14

## 2012-06-02 ENCOUNTER — Other Ambulatory Visit: Payer: 59

## 2012-06-02 DIAGNOSIS — O09219 Supervision of pregnancy with history of pre-term labor, unspecified trimester: Secondary | ICD-10-CM

## 2012-06-02 MED ORDER — HYDROXYPROGESTERONE CAPROATE 250 MG/ML IM OIL
250.0000 mg | TOPICAL_OIL | Freq: Once | INTRAMUSCULAR | Status: AC
  Administered 2012-06-02: 250 mg via INTRAMUSCULAR

## 2012-06-09 ENCOUNTER — Other Ambulatory Visit: Payer: 59

## 2012-06-09 MED ORDER — HYDROXYPROGESTERONE CAPROATE 250 MG/ML IM OIL
250.0000 mg | TOPICAL_OIL | Freq: Once | INTRAMUSCULAR | Status: AC
  Administered 2012-06-09: 250 mg via INTRAMUSCULAR

## 2012-06-09 NOTE — Progress Notes (Signed)
17 p given r glut.  ld

## 2012-06-14 ENCOUNTER — Other Ambulatory Visit: Payer: Self-pay

## 2012-06-14 DIAGNOSIS — Z3689 Encounter for other specified antenatal screening: Secondary | ICD-10-CM

## 2012-06-16 ENCOUNTER — Ambulatory Visit: Payer: 59 | Admitting: Certified Nurse Midwife

## 2012-06-16 ENCOUNTER — Ambulatory Visit: Payer: 59

## 2012-06-16 VITALS — BP 110/68 | Wt 269.0 lb

## 2012-06-16 DIAGNOSIS — Z331 Pregnant state, incidental: Secondary | ICD-10-CM

## 2012-06-16 DIAGNOSIS — Z3689 Encounter for other specified antenatal screening: Secondary | ICD-10-CM

## 2012-06-16 DIAGNOSIS — O09219 Supervision of pregnancy with history of pre-term labor, unspecified trimester: Secondary | ICD-10-CM

## 2012-06-16 DIAGNOSIS — Z1389 Encounter for screening for other disorder: Secondary | ICD-10-CM

## 2012-06-16 LAB — US OB COMP + 14 WK

## 2012-06-16 MED ORDER — HYDROXYPROGESTERONE CAPROATE 250 MG/ML IM OIL
250.0000 mg | TOPICAL_OIL | Freq: Once | INTRAMUSCULAR | Status: AC
  Administered 2012-06-16: 250 mg via INTRAMUSCULAR

## 2012-06-16 NOTE — Progress Notes (Signed)
[redacted]w[redacted]d Pt reported occasional cramping and 1 episode of pelvic pressure.  Encouraged her to call when this happens. Quad results rev'd  U/S: SIUP, vtx, anterior placenta, no previa, normal fluid, [place] c/w dates. Anatomy not seen: 4 ch heart, fetal cord insertion, RVOT, LVOT, DA, AA, spine and gender. F/u anatomy scan in 2-3 week with ROB Weekly P-17

## 2012-06-16 NOTE — Progress Notes (Signed)
Pt stated no issues today.  

## 2012-06-16 NOTE — Patient Instructions (Signed)
Preventing Preterm Labor Preterm labor is when a pregnant woman has contractions that cause the cervix to open, shorten, and thin before 37 weeks of pregnancy. You will have regular contractions (tightening) 2 to 3 minutes apart. This usually causes discomfort or pain. HOME CARE  Eat a healthy diet.  Take your vitamins as told by your doctor.  Drink enough fluids to keep your pee (urine) clear or pale yellow every day.  Get rest and sleep.  Do not have sex if you are at high risk for preterm labor.  Follow your doctor's advice about activity, medicines, and tests.  Avoid stress.  Avoid hard labor or exercise that lasts for a long time.  Do not smoke. GET HELP RIGHT AWAY IF:   You are having contractions.  You have belly (abdominal) pain.  You have bleeding from your vagina.  You have pain when you pee (urinate).  You have abnormal discharge from your vagina.  You have a temperature by mouth above 102 F (38.9 C). MAKE SURE YOU:  Understand these instructions.  Will watch your condition.  Will get help if you are not doing well or get worse. Document Released: 07/09/2008 Document Revised: 07/05/2011 Document Reviewed: 07/09/2008 [facility] Patient Information 2013 [place], [place]. Preventing Preterm Labor Preterm labor is when a pregnant woman has contractions that cause the cervix to open, shorten, and thin before 37 weeks of pregnancy. You will have regular contractions (tightening) 2 to 3 minutes apart. This usually causes discomfort or pain. HOME CARE  Eat a healthy diet.  Take your vitamins as told by your doctor.  Drink enough fluids to keep your pee (urine) clear or pale yellow every day.  Get rest and sleep.  Do not have sex if you are at high risk for preterm labor.  Follow your doctor's advice about activity, medicines, and tests.  Avoid stress.  Avoid hard labor or exercise that lasts for a long time.  Do not smoke. GET HELP RIGHT AWAY IF:    You are having contractions.  You have belly (abdominal) pain.  You have bleeding from your vagina.  You have pain when you pee (urinate).  You have abnormal discharge from your vagina.  You have a temperature by mouth above 102 F (38.9 C). MAKE SURE YOU:  Understand these instructions.  Will watch your condition.  Will get help if you are not doing well or get worse. Document Released: 07/09/2008 Document Revised: 07/05/2011 Document Reviewed: 07/09/2008 [facility] Patient Information 2013 [place], [place].

## 2012-06-23 ENCOUNTER — Other Ambulatory Visit: Payer: 59

## 2012-06-23 DIAGNOSIS — Z3009 Encounter for other general counseling and advice on contraception: Secondary | ICD-10-CM

## 2012-06-23 MED ORDER — HYDROXYPROGESTERONE CAPROATE 250 MG/ML IM OIL
250.0000 mg | TOPICAL_OIL | Freq: Once | INTRAMUSCULAR | Status: AC
  Administered 2012-06-23: 250 mg via INTRAMUSCULAR

## 2012-06-30 ENCOUNTER — Other Ambulatory Visit: Payer: 59

## 2012-06-30 MED ORDER — HYDROXYPROGESTERONE CAPROATE 250 MG/ML IM OIL
250.0000 mg | TOPICAL_OIL | Freq: Once | INTRAMUSCULAR | Status: AC
  Administered 2012-06-30: 250 mg via INTRAMUSCULAR

## 2012-06-30 NOTE — Progress Notes (Signed)
Given by HD.  ld

## 2012-07-03 ENCOUNTER — Other Ambulatory Visit: Payer: Self-pay

## 2012-07-03 DIAGNOSIS — Z3689 Encounter for other specified antenatal screening: Secondary | ICD-10-CM

## 2012-07-07 ENCOUNTER — Ambulatory Visit: Payer: 59 | Admitting: Family Medicine

## 2012-07-07 ENCOUNTER — Ambulatory Visit: Payer: 59

## 2012-07-07 VITALS — BP 108/64 | Wt 275.0 lb

## 2012-07-07 DIAGNOSIS — Z331 Pregnant state, incidental: Secondary | ICD-10-CM

## 2012-07-07 DIAGNOSIS — O358XX0 Maternal care for other (suspected) fetal abnormality and damage, not applicable or unspecified: Secondary | ICD-10-CM

## 2012-07-07 DIAGNOSIS — Z3689 Encounter for other specified antenatal screening: Secondary | ICD-10-CM

## 2012-07-07 LAB — US OB FOLLOW UP

## 2012-07-07 MED ORDER — HYDROXYPROGESTERONE CAPROATE 250 MG/ML IM OIL
250.0000 mg | TOPICAL_OIL | Freq: Once | INTRAMUSCULAR | Status: AC
  Administered 2012-07-07: 250 mg via INTRAMUSCULAR

## 2012-07-07 NOTE — Progress Notes (Signed)
[redacted]w[redacted]d Doing well, good fetal movement.  Occasionally back pain, but not associated with VB, LOF, or abdominal cramps.   [place] report for F/U anatomy reviewed, no questions. Discussed comfort measures for back pain and exercise handout given. Needs early glucola with 1 week 17-P next week. L.Carter, FNP-BC

## 2012-07-07 NOTE — Progress Notes (Signed)
[redacted]w[redacted]d Complains of bad back pain earlier this week. Ultrasound shows:  SIUP  S=D     [place] EDD: 11/04/2012           Normal fluid. AP pocket=3.5cm           Cervical length: 3.26 cm           Placenta localization: anterior           Fetal presentation: Vertex                   Anatomy survey is normal/all anatomy not previously seen, is visualized today.           Gender : female                                 Normal linear growth. EFW=55%tile                                 Cervix is closed. Normal adnexa.

## 2012-08-09 ENCOUNTER — Encounter: Payer: Self-pay | Admitting: *Deleted

## 2012-08-09 ENCOUNTER — Encounter: Payer: 59 | Attending: Obstetrics and Gynecology | Admitting: *Deleted

## 2012-08-09 DIAGNOSIS — Z713 Dietary counseling and surveillance: Secondary | ICD-10-CM | POA: Insufficient documentation

## 2012-08-09 DIAGNOSIS — O99810 Abnormal glucose complicating pregnancy: Secondary | ICD-10-CM | POA: Insufficient documentation

## 2012-08-09 NOTE — Patient Instructions (Addendum)
Goals:  Check glucose levels per MD as instructed  Follow Gestational Diabetes Diet as instructed  Call for follow-up as needed    

## 2012-08-09 NOTE — Progress Notes (Signed)
  Patient was seen on 08/09/2012 for Gestational Diabetes self-management class at the [facility]. The following learning objectives were met by the patient during this course:   States the definition of Gestational Diabetes  States why dietary management is important in controlling blood glucose  Describes the effects each nutrient has on blood glucose levels  Demonstrates ability to create a balanced meal plan  Demonstrates carbohydrate counting   States when to check blood glucose levels  Demonstrates proper blood glucose monitoring techniques  States the effect of stress and exercise on blood glucose levels  States the importance of limiting caffeine and abstaining from alcohol and smoking  Blood glucose monitor given: Blood glucose monitor given: Contour Next EZ Blood Glucose Kit Lot # [id number] Exp: 05/2013 Blood glucose reading: 124 mg/dl  Patient instructed to monitor glucose levels: FBS: 60 - <90 2 hour: <120  *Patient received handouts:  Nutrition Diabetes and Pregnancy  Carbohydrate Counting List  Insulin Instruction  Patient was seen on 08/09/2012 for insulin instruction.  The following learning objectives were met by the patient during this visit:   Insulin Action of Regular and NPH insulins  Reviewed syringe & vial VS pen including # units per syringe,    length of needles, vial VS Pen cartridge and needles  Hygiene and storage  Drawing up single and mixed doses if using vials   Single dose   Mixed dose:   Rotation of Sites  Hypoglycemia- symptoms, causes , treatment choices  Record keeping and MD follow up  Hypoglycemia, causes, symptoms and treatment   Patient demonstrated understanding of insulin administration by return demonstration.  Patient received the following handouts:  Insulin Instruction Handout  Mixing Insulin Brochure by [facility] Getting Started                                        Patient to start on insulin  if Rx'd by MD  Patient will be seen for follow-up as needed.

## 2012-10-24 ENCOUNTER — Telehealth (HOSPITAL_COMMUNITY): Payer: Self-pay | Admitting: *Deleted

## 2012-10-24 ENCOUNTER — Encounter (HOSPITAL_COMMUNITY): Payer: Self-pay | Admitting: *Deleted

## 2012-10-24 NOTE — Telephone Encounter (Signed)
Preadmission screen  

## 2012-10-25 ENCOUNTER — Telehealth (HOSPITAL_COMMUNITY): Payer: Self-pay | Admitting: *Deleted

## 2012-10-25 NOTE — Telephone Encounter (Signed)
Preadmission screen  

## 2012-10-27 ENCOUNTER — Inpatient Hospital Stay (HOSPITAL_COMMUNITY)
Admission: AD | Admit: 2012-10-27 | Discharge: 2012-10-29 | DRG: 775 | Disposition: A | Discharge status: Home / Self Care | Payer: 59 | Source: Ambulatory Visit | Attending: Obstetrics and Gynecology | Admitting: Obstetrics and Gynecology

## 2012-10-27 ENCOUNTER — Encounter (HOSPITAL_COMMUNITY): Payer: Self-pay | Admitting: *Deleted

## 2012-10-27 ENCOUNTER — Encounter (HOSPITAL_COMMUNITY): Payer: Self-pay | Admitting: Anesthesiology

## 2012-10-27 ENCOUNTER — Inpatient Hospital Stay (HOSPITAL_COMMUNITY): Payer: 59 | Admitting: Anesthesiology

## 2012-10-27 DIAGNOSIS — O99892 Other specified diseases and conditions complicating childbirth: Secondary | ICD-10-CM | POA: Diagnosis present

## 2012-10-27 DIAGNOSIS — O24419 Gestational diabetes mellitus in pregnancy, unspecified control: Secondary | ICD-10-CM | POA: Diagnosis present

## 2012-10-27 DIAGNOSIS — O99814 Abnormal glucose complicating childbirth: Principal | ICD-10-CM | POA: Diagnosis present

## 2012-10-27 DIAGNOSIS — Z22330 Carrier of Group B streptococcus: Secondary | ICD-10-CM

## 2012-10-27 DIAGNOSIS — Z794 Long term (current) use of insulin: Secondary | ICD-10-CM

## 2012-10-27 LAB — TYPE AND SCREEN

## 2012-10-27 LAB — CBC
Hemoglobin: 12.9 g/dL (ref 12.0–15.0)
MCH: 27.1 pg (ref 26.0–34.0)
MCHC: 33.9 g/dL (ref 30.0–36.0)
Platelets: 258 10*3/uL (ref 150–400)

## 2012-10-27 LAB — RPR: RPR Ser Ql: NONREACTIVE

## 2012-10-27 MED ORDER — OXYTOCIN BOLUS FROM INFUSION: 500.0000 mL | INTRAVENOUS | Status: DC

## 2012-10-27 MED ORDER — WITCH HAZEL-GLYCERIN EX PADS: 1.0000 "application " | MEDICATED_PAD | CUTANEOUS | Status: DC | PRN

## 2012-10-27 MED ORDER — DIBUCAINE 1 % RE OINT: 1.0000 "application " | TOPICAL_OINTMENT | RECTAL | Status: DC | PRN

## 2012-10-27 MED ORDER — MISOPROSTOL 200 MCG PO TABS
800.0000 ug | ORAL_TABLET | Freq: Once | ORAL | Status: AC
  Administered 2012-10-27: 800 ug via RECTAL

## 2012-10-27 MED ORDER — EPHEDRINE 5 MG/ML INJ
10.0000 mg | INTRAVENOUS | Status: DC | PRN
  Filled 2012-10-27: qty 2

## 2012-10-27 MED ORDER — EPHEDRINE 5 MG/ML INJ
10.0000 mg | INTRAVENOUS | Status: DC | PRN
  Filled 2012-10-27: qty 4
  Filled 2012-10-27: qty 2

## 2012-10-27 MED ORDER — PHENYLEPHRINE 40 MCG/ML (10ML) SYRINGE FOR IV PUSH (FOR BLOOD PRESSURE SUPPORT)
80.0000 ug | PREFILLED_SYRINGE | INTRAVENOUS | Status: DC | PRN
  Filled 2012-10-27: qty 2

## 2012-10-27 MED ORDER — MISOPROSTOL 200 MCG PO TABS
ORAL_TABLET | ORAL | Status: AC
  Filled 2012-10-27: qty 4

## 2012-10-27 MED ORDER — PRENATAL MULTIVITAMIN CH
1.0000 | ORAL_TABLET | Freq: Every day | ORAL | Status: DC
  Administered 2012-10-28: 1 via ORAL
  Filled 2012-10-27: qty 1

## 2012-10-27 MED ORDER — OXYTOCIN 40 UNITS IN LACTATED RINGERS INFUSION - SIMPLE MED
INTRAVENOUS | Status: AC
  Filled 2012-10-27: qty 1000

## 2012-10-27 MED ORDER — LACTATED RINGERS IV SOLN: 500.0000 mL | Freq: Once | INTRAVENOUS | Status: DC

## 2012-10-27 MED ORDER — LACTATED RINGERS IV SOLN
INTRAVENOUS | Status: DC
  Administered 2012-10-27: 12:00:00 via INTRAVENOUS

## 2012-10-27 MED ORDER — LACTATED RINGERS IV SOLN: INTRAVENOUS | Status: DC

## 2012-10-27 MED ORDER — TETANUS-DIPHTH-ACELL PERTUSSIS 5-2.5-18.5 LF-MCG/0.5 IM SUSP: 0.5000 mL | Freq: Once | INTRAMUSCULAR | Status: DC

## 2012-10-27 MED ORDER — ZOLPIDEM TARTRATE 5 MG PO TABS: 5.0000 mg | ORAL_TABLET | Freq: Every evening | ORAL | Status: DC | PRN

## 2012-10-27 MED ORDER — ONDANSETRON HCL 4 MG/2ML IJ SOLN: 4.0000 mg | INTRAMUSCULAR | Status: DC | PRN

## 2012-10-27 MED ORDER — BENZOCAINE-MENTHOL 20-0.5 % EX AERO: 1.0000 "application " | INHALATION_SPRAY | CUTANEOUS | Status: DC | PRN

## 2012-10-27 MED ORDER — FLEET ENEMA 7-19 GM/118ML RE ENEM: 1.0000 | ENEMA | RECTAL | Status: DC | PRN

## 2012-10-27 MED ORDER — OXYTOCIN 40 UNITS IN LACTATED RINGERS INFUSION - SIMPLE MED: 62.5000 mL/h | INTRAVENOUS | Status: DC

## 2012-10-27 MED ORDER — PENICILLIN G POTASSIUM 5000000 UNITS IJ SOLR
2.5000 10*6.[IU] | INTRAVENOUS | Status: DC
  Administered 2012-10-27: 2.5 10*6.[IU] via INTRAVENOUS
  Filled 2012-10-27 (×4): qty 2.5

## 2012-10-27 MED ORDER — SENNOSIDES-DOCUSATE SODIUM 8.6-50 MG PO TABS
2.0000 | ORAL_TABLET | Freq: Every day | ORAL | Status: DC
  Administered 2012-10-27 – 2012-10-28 (×2): 2 via ORAL

## 2012-10-27 MED ORDER — LIDOCAINE HCL (PF) 1 % IJ SOLN
30.0000 mL | INTRAMUSCULAR | Status: DC | PRN
  Filled 2012-10-27: qty 30

## 2012-10-27 MED ORDER — PHENYLEPHRINE 40 MCG/ML (10ML) SYRINGE FOR IV PUSH (FOR BLOOD PRESSURE SUPPORT)
80.0000 ug | PREFILLED_SYRINGE | INTRAVENOUS | Status: DC | PRN
  Filled 2012-10-27: qty 2
  Filled 2012-10-27: qty 5

## 2012-10-27 MED ORDER — LACTATED RINGERS IV SOLN: 500.0000 mL | INTRAVENOUS | Status: DC | PRN

## 2012-10-27 MED ORDER — SIMETHICONE 80 MG PO CHEW: 80.0000 mg | CHEWABLE_TABLET | ORAL | Status: DC | PRN

## 2012-10-27 MED ORDER — CITRIC ACID-SODIUM CITRATE 334-500 MG/5ML PO SOLN: 30.0000 mL | ORAL | Status: DC | PRN

## 2012-10-27 MED ORDER — ACETAMINOPHEN 325 MG PO TABS: 650.0000 mg | ORAL_TABLET | ORAL | Status: DC | PRN

## 2012-10-27 MED ORDER — ONDANSETRON HCL 4 MG PO TABS: 4.0000 mg | ORAL_TABLET | ORAL | Status: DC | PRN

## 2012-10-27 MED ORDER — PENICILLIN G POTASSIUM 5000000 UNITS IJ SOLR
5.0000 10*6.[IU] | Freq: Once | INTRAVENOUS | Status: AC
  Administered 2012-10-27: 5 10*6.[IU] via INTRAVENOUS
  Filled 2012-10-27: qty 5

## 2012-10-27 MED ORDER — TERBUTALINE SULFATE 1 MG/ML IJ SOLN: 0.2500 mg | Freq: Once | INTRAMUSCULAR | Status: DC | PRN

## 2012-10-27 MED ORDER — IBUPROFEN 600 MG PO TABS: 600.0000 mg | ORAL_TABLET | Freq: Four times a day (QID) | ORAL | Status: DC

## 2012-10-27 MED ORDER — OXYCODONE-ACETAMINOPHEN 5-325 MG PO TABS: 1.0000 | ORAL_TABLET | ORAL | Status: DC | PRN

## 2012-10-27 MED ORDER — ONDANSETRON HCL 4 MG/2ML IJ SOLN: 4.0000 mg | Freq: Four times a day (QID) | INTRAMUSCULAR | Status: DC | PRN

## 2012-10-27 MED ORDER — DIPHENHYDRAMINE HCL 50 MG/ML IJ SOLN: 12.5000 mg | INTRAMUSCULAR | Status: DC | PRN

## 2012-10-27 MED ORDER — OXYCODONE-ACETAMINOPHEN 5-325 MG PO TABS
1.0000 | ORAL_TABLET | ORAL | Status: DC | PRN
  Administered 2012-10-27 (×2): 1 via ORAL
  Administered 2012-10-28: 2 via ORAL
  Administered 2012-10-28: 1 via ORAL
  Administered 2012-10-28: 2 via ORAL
  Administered 2012-10-29: 1 via ORAL
  Filled 2012-10-27 (×3): qty 1
  Filled 2012-10-27: qty 2
  Filled 2012-10-27 (×4): qty 1

## 2012-10-27 MED ORDER — OXYTOCIN 40 UNITS IN LACTATED RINGERS INFUSION - SIMPLE MED
1.0000 m[IU]/min | INTRAVENOUS | Status: DC
  Administered 2012-10-27: 2 m[IU]/min via INTRAVENOUS

## 2012-10-27 MED ORDER — DIPHENHYDRAMINE HCL 25 MG PO CAPS: 25.0000 mg | ORAL_CAPSULE | Freq: Four times a day (QID) | ORAL | Status: DC | PRN

## 2012-10-27 MED ORDER — FENTANYL 2.5 MCG/ML BUPIVACAINE 1/10 % EPIDURAL INFUSION (WH - ANES)
14.0000 mL/h | INTRAMUSCULAR | Status: DC | PRN
  Administered 2012-10-27: 14 mL/h via EPIDURAL
  Filled 2012-10-27: qty 125

## 2012-10-27 MED ORDER — LANOLIN HYDROUS EX OINT: TOPICAL_OINTMENT | CUTANEOUS | Status: DC | PRN

## 2012-10-27 MED ORDER — SODIUM BICARBONATE 8.4 % IV SOLN
INTRAVENOUS | Status: DC | PRN
  Administered 2012-10-27: 5 mL via EPIDURAL

## 2012-10-27 NOTE — Anesthesia Procedure Notes (Signed)

## 2012-10-27 NOTE — Anesthesia Preprocedure Evaluation (Signed)
Anesthesia Evaluation  Patient identified by MRN, date of birth, ID band Patient awake    Reviewed: Allergy & Precautions, H&P , Patient's Chart, lab work & pertinent test results  Airway Mallampati: II TM Distance: >3 FB Neck ROM: full    Dental  (+) Teeth Intact   Pulmonary  breath sounds clear to auscultation        Cardiovascular Rhythm:regular Rate:Normal     Neuro/Psych    GI/Hepatic   Endo/Other  diabetes, Type obesity  Renal/GU      Musculoskeletal   Abdominal   Peds  Hematology   Anesthesia Other Findings       Reproductive/Obstetrics (+) Pregnancy                           Anesthesia Physical Anesthesia Plan  ASA: III  Anesthesia Plan: Epidural   Post-op Pain Management:    Induction:   Airway Management Planned:   Additional Equipment:   Intra-op Plan:   Post-operative Plan:   Informed Consent: I have reviewed the patients History and Physical, chart, labs and discussed the procedure including the risks, benefits and alternatives for the proposed anesthesia with the patient or authorized representative who has indicated his/her understanding and acceptance.   Dental Advisory Given  Plan Discussed with:   Anesthesia Plan Comments: (Labs checked- platelets confirmed with RN in room. Fetal heart tracing, per RN, reported to be stable enough for sitting procedure. Discussed epidural, and patient consents to the procedure:  included risk of possible headache,backache, failed block, allergic reaction, and nerve injury. This patient was asked if she had any questions or concerns before the procedure started. )        Anesthesia Quick Evaluation

## 2012-10-27 NOTE — H&P (Signed)
Amber Morales is a 27 y.o. female, [id number] at 16 6/7 weeks, presenting for induction due to gestational diabetes on glyburide and AC lag worsening on [place] yesterday. CBGs have been stable on med.  History OB History   Grav Para Term Preterm Abortions TAB SAB Ect Mult Living   4 2 2  1  1   2      Obstetric Comments   2008 PTL AT 35 WEEKS; ON MEDS     Past Medical History  Diagnosis Date  . Allergic rhinitis   . PONV (postoperative nausea and vomiting)   . Abnormal Pap smear     LAST PAPP 04/2011  . Preterm labor 2008  . Infection     UTI X 1  . Anemia     CHRONIC  . Headache(784.0) X 10 YEARS    MIGRAINES; OTC MED  . Fracture of wrist CHILDHOOD  . Fracture of arm CHILDHOOD  . Diabetes mellitus without complication   . Gestational diabetes     glyburide   Past Surgical History  Procedure Laterality Date  . Dilation and curettage of uterus  2011   Family History: family history includes Arthritis in her maternal aunt; Asthma in her father, maternal grandmother, and sister; Birth defects in her cousin; Heart disease in her paternal grandmother; Hyperlipidemia in her mother; Hypertension in her paternal grandmother; Other in her mother; and Stroke in her maternal grandfather.  Social History:  reports that she has never smoked. She has never used smokeless tobacco. She reports that she does not drink alcohol or use illicit drugs.  Husband is involved and supportive (Mamadou Loa).   Prenatal Transfer Tool  Maternal Diabetes: Yes:  Diabetes Type:  Insulin/Medication controlled Genetic Screening: Normal Maternal Ultrasounds/Referrals: Abnormal:  Findings:   Other:AC lag at 5th%ile. Fetal Ultrasounds or other Referrals:  None Maternal Substance Abuse:  No Significant Maternal Medications:  Meds include: Other: Glyburide Significant Maternal Lab Results:  Lab values include: Group B Strep positive Other Comments:  None  ROS:  +FM  Exam Physical Exam  Chest clear Heart RRR  without murmur Abd gravid, NT Pelvic--cervix 3-4 cm, 60%, vtx, -1  Prenatal labs: ABO, Rh: --/--/A POS (07/04 0830) Antibody: NEG (07/04 0830) Rubella: 5.30 (12/20 1142) RPR: NON REAC (12/20 1142)  HBsAg: NEGATIVE (12/20 1142)  HIV: NON REACTIVE (12/20 1142)  GBS: Positive (06/12 0000)   Assessment/Plan: IUP at 38 6/7  Gestational diabetic on glyburide Growth lag, worsening on [place] yesterday. GBS positive  Plan: Admit to Birthing Suite per order of Dr. Stefano Gaul with consult with Dr. Richardson Dopp Routine [facility] orders GBS prophylaxis with PCN CBGs q 4 hours Pitocin per low dose protocol. Epidural prn. Patient desires BTL, but consent signed 10/11/12 (has Medicaid secondary).   LATHAM, VICKI 10/27/2012 10a

## 2012-10-27 NOTE — Progress Notes (Signed)
  Subjective: Comfortable with epidural.  Objective: BP 102/68  Pulse 56  Temp(Src) 98.2 F (36.8 C) (Oral)  Ht 5\' 6"  (1.676 m)  Wt 252 lb (114.306 kg)  BMI 40.69 kg/m2  SpO2 99%  LMP 02/09/2012      FHT:  Early and late decels noted.  Moderate variability. UC:   regular, every 2-3 minutes, MVUs 250-310. On 10 mu/min pitocin SVE:   Dilation: 6 Effacement (%): 70 Station: -1 Exam by:: V.Ranger Petrich, CNM   Assessment / Plan: Progressive labor Decels, likely due to hyperstimulation Will decrease pitocin to 5 mu/min Amnioinfusion Dr. Richardson Dopp updated.  Nigel Bridgeman 10/27/2012, 2:11 PM

## 2012-10-28 ENCOUNTER — Inpatient Hospital Stay (HOSPITAL_COMMUNITY): Admission: RE | Admit: 2012-10-28 | Payer: 59 | Source: Ambulatory Visit

## 2012-10-28 LAB — GLUCOSE, CAPILLARY: Glucose-Capillary: 91 mg/dL (ref 70–99)

## 2012-10-28 LAB — CBC
MCH: 26.9 pg (ref 26.0–34.0)
MCHC: 33.4 g/dL (ref 30.0–36.0)
Platelets: 206 10*3/uL (ref 150–400)
RBC: 4.43 MIL/uL (ref 3.87–5.11)

## 2012-10-28 NOTE — Anesthesia Postprocedure Evaluation (Signed)
Anesthesia Post Note  Patient: Amber Morales  Procedure(s) Performed: * No procedures listed *  Anesthesia type: Epidural  Patient location: Mother/Baby  Post pain: Pain level controlled  Post assessment: Post-op Vital signs reviewed  Last Vitals:  Filed Vitals:   10/28/12 0500  BP: 107/61  Pulse: 50  Temp: 36.4 C    Post vital signs: Reviewed  Level of consciousness:alert  Complications: No apparent anesthesia complications

## 2012-10-28 NOTE — Progress Notes (Signed)
Post Partum Day 1:S/P SVB Subjective: Patient up ad lib, denies syncope or dizziness. Feeding:  Breast Contraceptive plan:   PP BTL after completion of 30 day waiting period (signed consent 10/11/12)  Using Percocet for pain, due to Ibuprophen allergy--good pain control.  Objective: Blood pressure 107/61, pulse 50, temperature 97.6 F (36.4 C), temperature source Oral, height 5\' 6"  (1.676 m), weight 252 lb (114.306 kg), last menstrual period 02/09/2012, SpO2 99.00%, unknown if currently breastfeeding.  Physical Exam:  General: alert Lochia: appropriate Uterine Fundus: firm Incision: NA DVT Evaluation: No evidence of DVT seen on physical exam. Negative Homan's sign.   Recent Labs  10/27/12 0830 10/28/12 0620  HGB 12.9 11.9*  HCT 38.1 35.6*    Assessment/Plan: S/P Vaginal delivery day 1 Continue current care Plan for discharge tomorrow Will plan Rx for Percocet at d/c, due to allergy to Ibuprophen. Patient plans to f/u with office to schedule pp BTL during 6 week pp recovery.   LOS: 1 day   Amber Morales, Chip Boer 10/28/2012, 9:53 AM

## 2012-10-29 DIAGNOSIS — O24419 Gestational diabetes mellitus in pregnancy, unspecified control: Secondary | ICD-10-CM | POA: Diagnosis present

## 2012-10-29 MED ORDER — OXYCODONE-ACETAMINOPHEN 5-325 MG PO TABS: 1.0000 | ORAL_TABLET | ORAL | Status: DC | PRN

## 2012-10-29 NOTE — Lactation Note (Signed)
This note was copied from the chart of Amber [place]. Lactation Consultation Note  Patient Name: Amber Morales [id number] Date: 10/29/2012 Reason for consult: Follow-up assessment   Maternal Data    Feeding   LATCH Score/Interventions    Lactation Tools Discussed/Used     Consult Status Consult Status: Complete   Mom reports that baby fed all night. Nipples are tender, slightly pink but intact. Comfort gels given with instructions for use and cleaning. No questions at present. To call prn Pamelia Hoit 10/29/2012, 9:12 AM

## 2012-10-29 NOTE — Discharge Summary (Signed)
Obstetric Discharge Summary Reason for Admission: induction of labor Prenatal Procedures: NST and ultrasound Intrapartum Procedures: spontaneous vaginal delivery, GBS prophylaxis and epidural Postpartum Procedures: fasting cbg's Complications-Operative and Postpartum: none  Temp:  [98.2 F (36.8 C)-98.7 F (37.1 C)] 98.2 F (36.8 C) (07/06 0646) Pulse Rate:  [60-66] 60 (07/06 0646) Resp:  [18-20] 18 (07/06 0646) BP: (103-117)/(60-61) 103/60 mmHg (07/06 0646) SpO2:  [98 %] 98 % (07/06 0646) Hemoglobin  Date Value Range Status  10/28/2012 11.9* 12.0 - 15.0 g/dL Final     HCT  Date Value Range Status  10/28/2012 35.6* 36.0 - 46.0 % Final    Hospital Course:  Hospital Course: Admitted for induction of labor on morning of 10/27/12 at [redacted]w[redacted]d secondary to GDM (on Glyburide) & worsening AC lag on u/s the prior day. Cx on admission=3.5/60/-1, and pt started on Pitocin.  Positive GBS, and treated with PCN per protocol. Pt received epidural just before noon, and AROM at 1230.  Hyperstimulation/FHR decels noted around 1400, and pitocin decreased from 10mu to 5mu and amnioinfusion started; cx=6/70/-1 at that time.  Progressed to fully dilated at 1458. Delivery was performed by Nigel Bridgeman, CNM without difficulty at 1506 on 10/27/12. Patient and baby tolerated the procedure without difficulty, and no lacerations noted. Pt received cytotec pr during third stage for prophylaxis.  Infant to FTN. Pt desired inpatient sterilization, however, 30-d papers were signed on 10/11/12, and plan made to defer procedure until 30 days had past.  Mother and infant then had an uncomplicated postpartum course, with breast feeding going well. Fasting cbg's stable PP. Mom's physical exam was WNL, and she was discharged home in stable condition. Contraception plan was BTL to be scheduled in 4-5 weeks.  She received adequate benefit from po pain medications; pt utilized percocet for pain secondary to Ibuprofen  allergy.  Discharge Diagnoses: Term Pregnancy-delivered and GDM--on Glyburide in pregnancy; SGA noted at time of birth w/ AC lag noted antenatally; lactating.  Discharge Information: Date: 10/29/2012 PPD#2 Activity: pelvic rest Diet: carbohydrate modified Medications:    Medication List         cetirizine 10 MG tablet  Commonly known as:  ZYRTEC  Take 10 mg by mouth daily.     oxyCODONE-acetaminophen 5-325 MG per tablet  Commonly known as:  PERCOCET/ROXICET  Take 1-2 tablets by mouth every 4 (four) hours as needed for pain.     prenatal multivitamin Tabs  Take 1 tablet by mouth daily.       Condition: stable Instructions: refer to practice specific booklet Discharge to: home     Follow-up Information   Follow up with [facility]. Schedule an appointment as soon as possible for a visit in 4 weeks. (to discuss tubal ligation, or call as needed with any questions or concerns)    Contact information:   [address] [place] [address] [phone]      Newborn Data: Live born  Information for the patient's newborn:  Shalonda, Sachse Girl Shenell [[id number]]  female "Makayla" APGAR 9,9 ; weight 5lb 0.2oz ;  Home with mother.  Hiyab Nhem H 10/29/2012, 8:10 AM

## 2012-11-20 ENCOUNTER — Other Ambulatory Visit: Payer: Self-pay | Admitting: Obstetrics and Gynecology

## 2012-11-30 ENCOUNTER — Encounter (HOSPITAL_COMMUNITY): Payer: Self-pay | Admitting: Pharmacist

## 2012-11-30 NOTE — H&P (Signed)
Admission History and Physical Exam for a Gynecology Patient  [name] is a 27 y.o. female, [phone], who presents for a laparoscopic tubal cautery. She has been followed at the [facility] and Gynecology division of [facility].  OB History   Grav Para Term Preterm Abortions TAB SAB Ect Mult Living   4 3 3  1  1   3      Obstetric Comments   2008 PTL AT 39 WEEKS; ON MEDS      Past Medical History  Diagnosis Date  . Allergic rhinitis   . PONV (postoperative nausea and vomiting)   . Abnormal Pap smear     LAST PAPP 04/2011  . Preterm labor 2008  . Infection     UTI X 1  . Anemia     CHRONIC  . Headache(784.0) X 10 YEARS    MIGRAINES; OTC MED  . Fracture of wrist CHILDHOOD  . Fracture of arm CHILDHOOD  . Diabetes mellitus without complication   . Gestational diabetes     glyburide    No prescriptions prior to admission    Past Surgical History  Procedure Laterality Date  . Dilation and curettage of uterus  2011    Allergies  Allergen Reactions  . Ibuprofen Hives    Family History: family history includes Arthritis in her maternal aunt; Asthma in her father, maternal grandmother, and sister; Birth defects in her cousin; Heart disease in her paternal grandmother; Hyperlipidemia in her mother; Hypertension in her paternal grandmother; Other in her mother; and Stroke in her maternal grandfather.  Social History:  reports that she has never smoked. She has never used smokeless tobacco. She reports that she does not drink alcohol or use illicit drugs.  Review of systems: See HPI.  Admission Physical Exam:    There is no weight on file to calculate BMI.    HEENT:                 Within normal limits Chest:                   Clear Heart:                    Regular rate and rhythm Breasts:                No masses, skin changes, bleeding, or discharge present Abdomen:             Nontender, no masses Extremities:           Grossly normal Neurologic exam: Grossly normal  Pelvic exam:  External genitalia: normal general appearance Vaginal: normal without tenderness, induration or masses Cervix: normal appearance Adnexa: normal bimanual exam Rectal: normal Uterus: normal size shape and consistency Assessment:  Desires sterilization  Plan:  Tubal sterilization procedure.  Alternatives reviewed.  Risk and benefits outlined.   Janine Limbo 11/30/2012

## 2012-12-01 ENCOUNTER — Ambulatory Visit (HOSPITAL_COMMUNITY): Payer: 59 | Admitting: Anesthesiology

## 2012-12-01 ENCOUNTER — Encounter (HOSPITAL_COMMUNITY): Payer: Self-pay | Admitting: Anesthesiology

## 2012-12-01 ENCOUNTER — Encounter (HOSPITAL_COMMUNITY)
Admission: RE | Disposition: A | Discharge status: Home / Self Care | Payer: Self-pay | Source: Ambulatory Visit | Attending: Obstetrics and Gynecology

## 2012-12-01 ENCOUNTER — Encounter (HOSPITAL_COMMUNITY): Payer: Self-pay | Admitting: *Deleted

## 2012-12-01 ENCOUNTER — Ambulatory Visit (HOSPITAL_COMMUNITY)
Admission: RE | Admit: 2012-12-01 | Discharge: 2012-12-01 | Disposition: A | Discharge status: Home / Self Care | Payer: 59 | Source: Ambulatory Visit | Attending: Obstetrics and Gynecology | Admitting: Obstetrics and Gynecology

## 2012-12-01 DIAGNOSIS — Z302 Encounter for sterilization: Secondary | ICD-10-CM | POA: Insufficient documentation

## 2012-12-01 HISTORY — PX: LAPAROSCOPIC TUBAL LIGATION: SHX1937

## 2012-12-01 LAB — CBC
MCH: 26.8 pg (ref 26.0–34.0)
MCHC: 33.1 g/dL (ref 30.0–36.0)
Platelets: 271 10*3/uL (ref 150–400)
RBC: 5.08 MIL/uL (ref 3.87–5.11)
RDW: 13.2 % (ref 11.5–15.5)

## 2012-12-01 SURGERY — LIGATION, FALLOPIAN TUBE, LAPAROSCOPIC
Anesthesia: General | Laterality: Bilateral | Wound class: Clean Contaminated

## 2012-12-01 MED ORDER — PHENYLEPHRINE HCL 10 MG/ML IJ SOLN
INTRAMUSCULAR | Status: DC | PRN
  Administered 2012-12-01 (×4): 40 ug via INTRAVENOUS

## 2012-12-01 MED ORDER — PROPOFOL 10 MG/ML IV BOLUS
INTRAVENOUS | Status: DC | PRN
  Administered 2012-12-01: 120 mg via INTRAVENOUS

## 2012-12-01 MED ORDER — GLYCOPYRROLATE 0.2 MG/ML IJ SOLN
INTRAMUSCULAR | Status: DC | PRN
  Administered 2012-12-01: 0.4 mg via INTRAVENOUS
  Administered 2012-12-01: 0.2 mg via INTRAVENOUS

## 2012-12-01 MED ORDER — BUPIVACAINE-EPINEPHRINE (PF) 0.5% -1:200000 IJ SOLN
INTRAMUSCULAR | Status: AC
  Filled 2012-12-01: qty 10

## 2012-12-01 MED ORDER — PHENYLEPHRINE 40 MCG/ML (10ML) SYRINGE FOR IV PUSH (FOR BLOOD PRESSURE SUPPORT)
PREFILLED_SYRINGE | INTRAVENOUS | Status: AC
  Filled 2012-12-01: qty 5

## 2012-12-01 MED ORDER — ONDANSETRON HCL 4 MG/2ML IJ SOLN
INTRAMUSCULAR | Status: AC
  Filled 2012-12-01: qty 2

## 2012-12-01 MED ORDER — FENTANYL CITRATE 0.05 MG/ML IJ SOLN
INTRAMUSCULAR | Status: AC
  Filled 2012-12-01: qty 5

## 2012-12-01 MED ORDER — ATROPINE SULFATE 0.4 MG/ML IJ SOLN
INTRAMUSCULAR | Status: AC
  Filled 2012-12-01: qty 1

## 2012-12-01 MED ORDER — LIDOCAINE HCL (CARDIAC) 20 MG/ML IV SOLN
INTRAVENOUS | Status: DC | PRN
  Administered 2012-12-01: 50 mg via INTRAVENOUS

## 2012-12-01 MED ORDER — OXYCODONE-ACETAMINOPHEN 5-325 MG PO TABS: 1.0000 | ORAL_TABLET | ORAL | Status: DC | PRN

## 2012-12-01 MED ORDER — ONDANSETRON HCL 4 MG/2ML IJ SOLN
INTRAMUSCULAR | Status: DC | PRN
  Administered 2012-12-01: 4 mg via INTRAVENOUS

## 2012-12-01 MED ORDER — ATROPINE SULFATE 0.4 MG/ML IJ SOLN
INTRAMUSCULAR | Status: DC | PRN
  Administered 2012-12-01 (×2): .1 mg via INTRAVENOUS

## 2012-12-01 MED ORDER — MIDAZOLAM HCL 2 MG/2ML IJ SOLN
INTRAMUSCULAR | Status: AC
  Filled 2012-12-01: qty 2

## 2012-12-01 MED ORDER — LIDOCAINE HCL (CARDIAC) 20 MG/ML IV SOLN
INTRAVENOUS | Status: AC
  Filled 2012-12-01: qty 5

## 2012-12-01 MED ORDER — LACTATED RINGERS IV SOLN
INTRAVENOUS | Status: DC
  Administered 2012-12-01: 09:00:00 via INTRAVENOUS

## 2012-12-01 MED ORDER — BUPIVACAINE-EPINEPHRINE 0.5% -1:200000 IJ SOLN
INTRAMUSCULAR | Status: DC | PRN
  Administered 2012-12-01: 6 mL

## 2012-12-01 MED ORDER — DEXAMETHASONE SODIUM PHOSPHATE 10 MG/ML IJ SOLN
INTRAMUSCULAR | Status: AC
  Filled 2012-12-01: qty 1

## 2012-12-01 MED ORDER — FENTANYL CITRATE 0.05 MG/ML IJ SOLN
INTRAMUSCULAR | Status: DC | PRN
  Administered 2012-12-01: 25 ug via INTRAVENOUS
  Administered 2012-12-01: 100 ug via INTRAVENOUS
  Administered 2012-12-01: 50 ug via INTRAVENOUS
  Administered 2012-12-01: 25 ug via INTRAVENOUS

## 2012-12-01 MED ORDER — EPHEDRINE SULFATE 50 MG/ML IJ SOLN
INTRAMUSCULAR | Status: DC | PRN
  Administered 2012-12-01 (×4): 5 mg via INTRAVENOUS

## 2012-12-01 MED ORDER — EPHEDRINE 5 MG/ML INJ
INTRAVENOUS | Status: AC
  Filled 2012-12-01: qty 10

## 2012-12-01 MED ORDER — FENTANYL CITRATE 0.05 MG/ML IJ SOLN
INTRAMUSCULAR | Status: AC
  Administered 2012-12-01: 50 ug via INTRAVENOUS
  Filled 2012-12-01: qty 2

## 2012-12-01 MED ORDER — NEOSTIGMINE METHYLSULFATE 1 MG/ML IJ SOLN
INTRAMUSCULAR | Status: AC
  Filled 2012-12-01: qty 1

## 2012-12-01 MED ORDER — PROPOFOL 10 MG/ML IV EMUL
INTRAVENOUS | Status: AC
  Filled 2012-12-01: qty 20

## 2012-12-01 MED ORDER — ROCURONIUM BROMIDE 50 MG/5ML IV SOLN
INTRAVENOUS | Status: AC
  Filled 2012-12-01: qty 1

## 2012-12-01 MED ORDER — ROCURONIUM BROMIDE 100 MG/10ML IV SOLN
INTRAVENOUS | Status: DC | PRN
  Administered 2012-12-01: 10 mg via INTRAVENOUS
  Administered 2012-12-01: 25 mg via INTRAVENOUS

## 2012-12-01 MED ORDER — PROMETHAZINE HCL 12.5 MG PO TABS: 12.5000 mg | ORAL_TABLET | Freq: Four times a day (QID) | ORAL | Status: DC | PRN

## 2012-12-01 MED ORDER — DEXAMETHASONE SODIUM PHOSPHATE 4 MG/ML IJ SOLN
INTRAMUSCULAR | Status: DC | PRN
  Administered 2012-12-01: 8 mg via INTRAVENOUS

## 2012-12-01 MED ORDER — GLYCOPYRROLATE 0.2 MG/ML IJ SOLN
INTRAMUSCULAR | Status: AC
  Filled 2012-12-01: qty 3

## 2012-12-01 MED ORDER — FENTANYL CITRATE 0.05 MG/ML IJ SOLN
25.0000 ug | INTRAMUSCULAR | Status: DC | PRN
  Administered 2012-12-01: 50 ug via INTRAVENOUS

## 2012-12-01 MED ORDER — MIDAZOLAM HCL 5 MG/5ML IJ SOLN
INTRAMUSCULAR | Status: DC | PRN
  Administered 2012-12-01: 1 mg via INTRAVENOUS

## 2012-12-01 MED ORDER — NEOSTIGMINE METHYLSULFATE 1 MG/ML IJ SOLN
INTRAMUSCULAR | Status: DC | PRN
  Administered 2012-12-01: 3 mg via INTRAVENOUS

## 2012-12-01 SURGICAL SUPPLY — 15 items
CATH ROBINSON RED A/P 16FR (CATHETERS) ×2 IMPLANT
CHLORAPREP W/TINT 26ML (MISCELLANEOUS) ×2 IMPLANT
CLOTH BEACON ORANGE TIMEOUT ST (SAFETY) ×2 IMPLANT
DRSG COVADERM PLUS 2X2 (GAUZE/BANDAGES/DRESSINGS) ×1 IMPLANT
GLOVE BIOGEL PI IND STRL 8.5 (GLOVE) ×1 IMPLANT
GLOVE BIOGEL PI INDICATOR 8.5 (GLOVE) ×1
GLOVE ECLIPSE 8.0 STRL XLNG CF (GLOVE) ×4 IMPLANT
GLOVE NEODERM STER SZ 7 (GLOVE) ×1 IMPLANT
GOWN PREVENTION PLUS LG XLONG (DISPOSABLE) ×4 IMPLANT
GOWN PREVENTION PLUS XXLARGE (GOWN DISPOSABLE) ×2 IMPLANT
PACK LAPAROSCOPY BASIN (CUSTOM PROCEDURE TRAY) ×2 IMPLANT
SUT MNCRL AB 4-0 PS2 18 (SUTURE) ×2 IMPLANT
SUT VICRYL 0 UR6 27IN ABS (SUTURE) ×2 IMPLANT
TOWEL OR 17X24 6PK STRL BLUE (TOWEL DISPOSABLE) ×4 IMPLANT
WATER STERILE IRR 1000ML POUR (IV SOLUTION) ×2 IMPLANT

## 2012-12-01 NOTE — Op Note (Signed)
OPERATIVE NOTE  Amber Morales  DOB:    02-27-1986  MRN:    [id number]  CSN:    [id number]  Date of Surgery:  12/01/2012  Preoperative Diagnosis:  Desires sterilization Obesity  Postoperative Diagnosis:  Desires sterilization  Obesity  Procedure:  Laparoscopic Tubal Cautery  Surgeon:  Leonard Schwartz, M.D.  Assistant:  None  Anesthetic:  Choice  Disposition:  Amber Morales is a 27 y.o. year old female,[id number], who presents for a tubal sterilization procedure. She understands the indications for her operation as well as the alternative treatment options. She accepts the risk of, but not limited to, anesthetic complications, bleeding, infections, possible damage to the surrounding organs, and possible tubal failure (17 per 1000).  Findings:  The uterus, fallopian tubes, and ovaries were normal. The bowel and the liver appeared normal.  Procedure:  The patient was taken to the operating room where a general anesthetic was given. The patient's abdomen, perineum, and vagina were prepped with sterile solution. The bladder was drained of urine. An examination under anesthesia was performed. A Hulka tenaculum was placed inside the uterus. The patient was sterilely draped. The subumbilical area was injected with half percent Marcaine with epinephrine. A subumbilical incision was made and the Veress needle was inserted into the abdominal cavity without difficulty. Proper placement was confirmed using the fluid drop test. A pneumoperitoneum was obtained. The laparoscopic trocar and the laparoscope were substituted for the Veress needle. The pelvic organs were inspected with findings as mentioned above. The right fallopian tube was identified and followed to the fimbriated end. The right fallopian tube was cauterized in its proximal portion in several areas until the tube was completely desiccated. Hemostasis was adequate. An identical procedure was carried out on the opposite  side. Again hemostasis was adequate. The pneumoperitoneum was allowed to escape. All instruments were removed. The subumbilical fascia was closed using 0 Vicryl. The skin was reapproximated using 3-0 Monocryl. Sponge and needle counts were correct. The estimated blood loss was less than 10 cc. The patient tolerated her procedure well. She was awakened from her anesthetic without difficulty. She was transported to the recovery room in stable condition.  Followup instructions:  The patient will return to see Dr. Stefano Gaul in 2 weeks. She was given a copy of the postoperative instructions as prepared by the [facility] for patients who've undergone laparoscopy.  Discharge medications:  Percocet 5/325 tablets          One or two tablets every 4 hours as needed for severe pain. Phenergan 12.5 mg tablets   One tablet every 6 hours as needed for nausea.  Leonard Schwartz, M.D.

## 2012-12-01 NOTE — Transfer of Care (Signed)
Immediate Anesthesia Transfer of Care Note  Patient: Amber Morales  Procedure(s) Performed: Procedure(s): LAPAROSCOPIC TUBAL LIGATION (Bilateral)  Patient Location: PACU  Anesthesia Type:General  Level of Consciousness: awake, alert  and patient cooperative  Airway & Oxygen Therapy: Patient Spontanous Breathing and Patient connected to nasal cannula oxygen  Post-op Assessment: Report given to PACU RN and Post -op Vital signs reviewed and stable  Post vital signs: stable  Complications: No apparent anesthesia complications

## 2012-12-01 NOTE — H&P (Signed)
The patient was interviewed and examined today.  The previously documented history and physical examination was reviewed. There are no changes. The operative procedure was reviewed. The risks and benefits were outlined again. The specific risks include, but are not limited to, anesthetic complications, bleeding, infections, and possible damage to the surrounding organs. The patient's questions were answered.  We are ready to proceed as outlined. The likelihood of the patient achieving the goals of this procedure is very likely.   Eilleen Davoli [place] Kolleen Ochsner, M.D.  

## 2012-12-01 NOTE — Anesthesia Preprocedure Evaluation (Signed)
Anesthesia Evaluation  Patient identified by MRN, date of birth, ID band Patient awake    Reviewed: Allergy & Precautions, H&P , Patient's Chart, lab work & pertinent test results, reviewed documented beta blocker date and time   Airway Mallampati: II TM Distance: >3 FB Neck ROM: full    Dental no notable dental hx.    Pulmonary  breath sounds clear to auscultation  Pulmonary exam normal       Cardiovascular Rhythm:regular Rate:Normal     Neuro/Psych    GI/Hepatic   Endo/Other  diabetes, GestationalMorbid obesity  Renal/GU      Musculoskeletal   Abdominal   Peds  Hematology   Anesthesia Other Findings   Reproductive/Obstetrics                           Anesthesia Physical Anesthesia Plan  ASA: III  Anesthesia Plan: General   Post-op Pain Management:    Induction: Intravenous  Airway Management Planned: Oral ETT  Additional Equipment:   Intra-op Plan:   Post-operative Plan: Extubation in OR  Informed Consent: I have reviewed the patients History and Physical, chart, labs and discussed the procedure including the risks, benefits and alternatives for the proposed anesthesia with the patient or authorized representative who has indicated his/her understanding and acceptance.   Dental Advisory Given and Dental advisory given  Plan Discussed with: CRNA and Surgeon  Anesthesia Plan Comments: (  Discussed general anesthesia, including possible nausea, instrumentation of airway, sore throat,pulmonary aspiration, etc. I asked if the were any outstanding questions, or  concerns before we proceeded. )        Anesthesia Quick Evaluation

## 2012-12-01 NOTE — Anesthesia Postprocedure Evaluation (Signed)
  Anesthesia Post-op Note  Anesthesia Post Note  Patient: Amber Morales  Procedure(s) Performed: Procedure(s) (LRB): LAPAROSCOPIC TUBAL LIGATION (Bilateral)  Anesthesia type: General  Patient location: PACU  Post pain: Pain level controlled  Post assessment: Post-op Vital signs reviewed  Last Vitals:  Filed Vitals:   12/01/12 1145  BP: 110/28  Pulse: 66  Temp: 36.4 C  Resp: 16    Post vital signs: Reviewed  Level of consciousness: sedated  Complications: No apparent anesthesia complications

## 2012-12-07 ENCOUNTER — Encounter (HOSPITAL_COMMUNITY): Payer: Self-pay | Admitting: Obstetrics and Gynecology

## 2012-12-08 ENCOUNTER — Encounter: Payer: Self-pay | Admitting: Internal Medicine

## 2012-12-08 ENCOUNTER — Ambulatory Visit (INDEPENDENT_AMBULATORY_CARE_PROVIDER_SITE_OTHER): Payer: 59 | Admitting: Internal Medicine

## 2012-12-08 VITALS — BP 118/68 | HR 88 | Temp 98.5°F | Resp 16 | Ht 65.0 in | Wt 237.6 lb

## 2012-12-08 DIAGNOSIS — R110 Nausea: Secondary | ICD-10-CM

## 2012-12-08 DIAGNOSIS — K5900 Constipation, unspecified: Secondary | ICD-10-CM

## 2012-12-08 NOTE — Patient Instructions (Addendum)
We suggest a Soap Subs Enema Or Mineral Oil rectal enema What is this medicine? MINERAL OIL (MIN-ah-rel OIL) is a laxative. This medicine is used to relieve occasional constipation or stool impaction. This medicine may be used for other purposes; ask your health care provider or pharmacist if you have questions. What should I tell my health care provider before I take this medicine? They need to know if you have any of these conditions: -abdominal pain -blood in your stool (black or tarry stools) or if you have blood in your vomit -stomach or intestinal problems -an unusual or allergic reaction to mineral oil, other medicines, foods, dyes, or preservatives -pregnant or trying to get pregnant -breast-feeding How should I use this medicine? This medicine is for rectal use only. Do not take by mouth. Follow the directions on the label. Wash your hands before and after use. Remove tip from enema. Gently insert enema tip into the rectum as directed. Squeeze the enema until the prescribed dose has been given. Gently remove enema tip from the rectum. Throw away any leftover medicine in the enema; do not reuse. Do not use more often than directed. Do not use for longer than 1 week without advice of your health care professional. Talk to your pediatrician regarding the use of this medicine in children. While this medicine may be used in young children, they do not receive the full enema dose and precautions do apply. Use only if advised by your pediatrician. Overdosage: If you think you've taken too much of this medicine contact a poison control center or emergency room at once. Overdosage: If you think you have taken too much of this medicine contact a poison control center or emergency room at once. NOTE: This medicine is only for you. Do not share this medicine with others. What if I miss a dose? If you miss a dose, use it as soon as you can. If it is almost time for your next dose, use only that dose.  Do not use double or extra doses. What may interact with this medicine? -docusate This list may not describe all possible interactions. Give your health care provider a list of all the medicines, herbs, non-prescription drugs, or dietary supplements you use. Also tell them if you smoke, drink alcohol, or use illegal drugs. Some items may interact with your medicine. What should I watch for while using this medicine? Tell your doctor or health care professional if your symptoms do not start to get better or get worse. Do not use this medicine for longer than directed by your doctor or health care professional. This medicine can be habit-forming. Long-term use can make your body depend on the laxative for regular bowel movements, damage the bowel, cause malnutrition, and problems with the amounts of water and salts in your body. If your constipation keeps returning, check with your doctor or health care professional. Drink fluids as directed to prevent dehydration and to assist stool passage. If stools become loose or you have diarrhea, discontinue use and consult your doctor or health care professional for advice, if necessary. What side effects may I notice from receiving this medicine? Side effects that you should report to your doctor or health care professional as soon as possible: -allergic reactions like skin rash, itching or hives, swelling of the face, lips, or tongue -diarrhea that is severe -difficulty breathing or shortness of breath  Side effects that usually do not require medical attention (Report these to your doctor or health care professional  if they continue or are bothersome.): -leakage of oil from rectum or rectal skin irritation -loose stools -lower stomach discomfort or cramps -nausea This list may not describe all possible side effects. Call your doctor for medical advice about side effects. You may report side effects to FDA at 1-800-FDA-1088. Where should I keep my  medicine? Keep out of the reach of children. Store at room temperature between 15 and 30 degrees C (59 and 86 degrees F). Throw away any unused medicine after the expiration date. NOTE: This sheet is a summary. It may not cover all possible information. If you have questions about this medicine, talk to your doctor, pharmacist, or health care provider.  2013, [employer]vier/Gold Standard. (07/14/2009 12:48:24 PM)  [place] soap, which is a mild, vegetable oil based soap is commonly used. For liquid Castile soap, approximately one teaspoon per 1000cc (quart) of water is used. Castile bar soap, and other bar soap with minimal additives such as [place] may be used, however these present difficulty in judging the appropriate amount of soap to add to the solution. In general, add no more than is required to turn the water a slightly milky color. Liquid handsoaps and detergents should not be used.   Fecal Impaction A fecal impaction happens when there is a large, firm amount of stool (poop) that cannot be passed. The impacted stool is usually in the rectum, which is the lowest part of the large bowel. The impacted stool can block the colon and cause significant problems. CAUSES  The longer stool stays in the rectum, the harder it gets. Anything that slows down your bowel movements can lead to fecal impaction. These conditions include:  Constipation (having firm hard stools). This can be a longstanding (chronic) problem, or can happen suddenly (acutely).  Painful conditions of the rectum, such as hemorrhoids or anal fissures. The pain of these conditions can make you try to avoid having bowel movements.  Narcotic pain medications cause constipation, which can sometimes be severe. If you take narcotic pain medication, you should also talk with your caregiver about preventing constipation.  Not getting enough fluids.  Inactivity and bed rest over long periods of time. SYMPTOMS  Some symptoms of fecal impaction  include:  Lack of normal bowel movements or changes in bowel patterns.  Sense of fullness in the rectum, but unable to pass stool.  Pain or cramps in the stomach or abdominal area (often after meals).  Thin, watery discharge from the rectum. Without treatment, a fecal impaction can block the colon and cause severe abdominal pain or colon tears (perforation). This may lead to surgery.  DIAGNOSIS  Fecal impaction is suspected based upon your symptoms and upon a physical examination. This will include an exam of your rectum, which can confirm the diagnosis. Sometimes x-rays or lab testing may be needed to confirm the diagnosis and be sure there are no other problems.  TREATMENT   Initially an impaction can be removed manually. Your caregiver, using a gloved finger, can remove hard stool from your rectum.  Medication is sometimes needed. A suppository or enema can be given in the rectum to soften the stool. This can stimulate a bowel movement. Medicines can also be given by mouth (orally).  Surgery may be needed if the colon has torn (perforated) due to blockage. This is very rare. HOME CARE INSTRUCTIONS   Develop regular bowel habits. This may be something such as getting in the habit of having a bowel movement after your morning cup of  coffee or after eating. Be sure to allow yourself enough time on the toilet.  Maintain a high fiber diet.  Drink plenty of fluids each day. This is especially true for the elderly and especially during cold weather when thirst may not be as strong. Try to take in at least eight, 8 ounce glasses of water daily unless instructed otherwise.  Exercise regularly.  If you begin to get constipated, increase the amount of fiber in your diet. Eat plenty of fruits, vegetables, whole wheat breads, bran, oatmeal and similar products.  Natural fiber laxatives such as Metamucil are also very helpful.  Speak with your caregiver if you suspect medications may be causing  constipation. SEEK MEDICAL CARE IF:   You have ongoing constipation or a hard time passing your stools.  You have ongoing rectal pain.  You require enemas or suppositories more than twice a week. SEEK IMMEDIATE MEDICAL CARE IF:   There are continued problems or you develop abdominal pain.  You develop rectal bleeding.  You develop black or tarry stools or feel lightheaded. MAKE SURE YOU:   Understand these instructions.  Will watch your condition.  Will get help right away if you are not doing well or get worse. Document Released: 01/03/2004 Document Revised: 07/05/2011 Document Reviewed: 12/21/2007 [facility] Patient Information 2014 [place], [place]. Constipation, Adult Constipation is when a person has fewer than 3 bowel movements a week; has difficulty having a bowel movement; or has stools that are dry, hard, or larger than normal. As people grow older, constipation is more common. If you try to fix constipation with medicines that make you have a bowel movement (laxatives), the problem may get worse. Long-term laxative use may cause the muscles of the colon to become weak. A low-fiber diet, not taking in enough fluids, and taking certain medicines may make constipation worse. CAUSES   Certain medicines, such as antidepressants, pain medicine, iron supplements, antacids, and water pills.   Certain diseases, such as diabetes, irritable bowel syndrome (IBS), thyroid disease, or depression.   Not drinking enough water.   Not eating enough fiber-rich foods.   Stress or travel.  Lack of physical activity or exercise.  Not going to the restroom when there is the urge to have a bowel movement.  Ignoring the urge to have a bowel movement.  Using laxatives too much. SYMPTOMS   Having fewer than 3 bowel movements a week.   Straining to have a bowel movement.   Having hard, dry, or larger than normal stools.   Feeling full or bloated.   Pain in the lower  abdomen.  Not feeling relief after having a bowel movement. DIAGNOSIS  Your caregiver will take a medical history and perform a physical exam. Further testing may be done for severe constipation. Some tests may include:   A barium enema X-ray to examine your rectum, colon, and sometimes, your small intestine.  A sigmoidoscopy to examine your lower colon.  A colonoscopy to examine your entire colon. TREATMENT  Treatment will depend on the severity of your constipation and what is causing it. Some dietary treatments include drinking more fluids and eating more fiber-rich foods. Lifestyle treatments may include regular exercise. If these diet and lifestyle recommendations do not help, your caregiver may recommend taking over-the-counter laxative medicines to help you have bowel movements. Prescription medicines may be prescribed if over-the-counter medicines do not work.  HOME CARE INSTRUCTIONS   Increase dietary fiber in your diet, such as fruits, vegetables, whole grains, and beans.  Limit high-fat and processed sugars in your diet, such as [place] fries, hamburgers, cookies, candies, and soda.   A fiber supplement may be added to your diet if you cannot get enough fiber from foods.   Drink enough fluids to keep your urine clear or pale yellow.   Exercise regularly or as directed by your caregiver.   Go to the restroom when you have the urge to go. Do not hold it.  Only take medicines as directed by your caregiver. Do not take other medicines for constipation without talking to your caregiver first. SEEK IMMEDIATE MEDICAL CARE IF:   You have bright red blood in your stool.   Your constipation lasts for more than 4 days or gets worse.   You have abdominal or rectal pain.   You have thin, pencil-like stools.  You have unexplained weight loss. MAKE SURE YOU:   Understand these instructions.  Will watch your condition.  Will get help right away if you are not doing well or  get worse. Document Released: 01/09/2004 Document Revised: 07/05/2011 Document Reviewed: 03/16/2011 [facility] Patient Information 2014 [place], [place].

## 2012-12-08 NOTE — Progress Notes (Signed)
  Subjective:    Patient ID: Amber Morales, female    DOB: May 10, 1985, 27 y.o.   MRN: [id number]  HPI Had surgery, tubal ligation and has been using oxycodone since, now constipated and nauseas. No vomiting or bleeding. Stool is very hard now.   Review of Systems     Objective:   Physical Exam  Vitals reviewed. Constitutional: She is oriented to person, place, and time. She appears well-developed and well-nourished. No distress.  Eyes: EOM are normal. No scleral icterus.  Pulmonary/Chest: Effort normal.  Abdominal: Soft. There is no tenderness.  Musculoskeletal: Normal range of motion.  Neurological: She is alert and oriented to person, place, and time. She exhibits normal muscle tone. Coordination normal.  Psychiatric: She has a normal mood and affect.          Assessment & Plan:  Constipation /early impaction. Soap suds enema to lubricate/Fleets to follow MOM/Miralax To ER if no success

## 2014-02-25 ENCOUNTER — Encounter: Payer: Self-pay | Admitting: Internal Medicine

## 2016-07-26 DIAGNOSIS — N63 Unspecified lump in unspecified breast: Secondary | ICD-10-CM

## 2016-07-26 HISTORY — DX: Unspecified lump in unspecified breast: N63.0

## 2016-07-30 DIAGNOSIS — Z889 Allergy status to unspecified drugs, medicaments and biological substances status: Secondary | ICD-10-CM | POA: Insufficient documentation

## 2016-08-04 ENCOUNTER — Other Ambulatory Visit: Payer: Self-pay | Admitting: Obstetrics and Gynecology

## 2016-08-04 DIAGNOSIS — N6320 Unspecified lump in the left breast, unspecified quadrant: Secondary | ICD-10-CM

## 2016-08-04 DIAGNOSIS — N946 Dysmenorrhea, unspecified: Secondary | ICD-10-CM | POA: Insufficient documentation

## 2016-08-04 DIAGNOSIS — N920 Excessive and frequent menstruation with regular cycle: Secondary | ICD-10-CM | POA: Insufficient documentation

## 2016-08-10 ENCOUNTER — Ambulatory Visit
Admission: RE | Admit: 2016-08-10 | Discharge: 2016-08-10 | Disposition: A | Discharge status: Home / Self Care | Payer: 59 | Source: Ambulatory Visit | Attending: Obstetrics and Gynecology | Admitting: Obstetrics and Gynecology

## 2016-08-10 DIAGNOSIS — N6320 Unspecified lump in the left breast, unspecified quadrant: Secondary | ICD-10-CM

## 2016-08-10 HISTORY — DX: Unspecified lump in unspecified breast: N63.0

## 2016-10-29 ENCOUNTER — Other Ambulatory Visit: Payer: Self-pay | Admitting: Obstetrics and Gynecology

## 2019-04-17 ENCOUNTER — Ambulatory Visit (INDEPENDENT_AMBULATORY_CARE_PROVIDER_SITE_OTHER): Payer: 59 | Admitting: Obstetrics and Gynecology

## 2019-04-17 ENCOUNTER — Other Ambulatory Visit: Payer: Self-pay

## 2019-04-17 ENCOUNTER — Encounter: Payer: Self-pay | Admitting: Obstetrics and Gynecology

## 2019-04-17 ENCOUNTER — Other Ambulatory Visit (HOSPITAL_COMMUNITY)
Admission: RE | Admit: 2019-04-17 | Discharge: 2019-04-17 | Disposition: A | Discharge status: Home / Self Care | Payer: 59 | Source: Ambulatory Visit | Attending: Obstetrics and Gynecology | Admitting: Obstetrics and Gynecology

## 2019-04-17 VITALS — BP 118/83 | HR 76 | Ht 66.5 in | Wt 270.5 lb

## 2019-04-17 DIAGNOSIS — B373 Candidiasis of vulva and vagina: Secondary | ICD-10-CM | POA: Diagnosis present

## 2019-04-17 DIAGNOSIS — L292 Pruritus vulvae: Secondary | ICD-10-CM

## 2019-04-17 DIAGNOSIS — B3731 Acute candidiasis of vulva and vagina: Secondary | ICD-10-CM

## 2019-04-17 MED ORDER — CLOBETASOL PROPIONATE 0.05 % EX CREA: 1.0000 "application " | TOPICAL_CREAM | Freq: Two times a day (BID) | CUTANEOUS | 0 refills | Status: AC

## 2019-04-17 MED ORDER — FLUCONAZOLE 150 MG PO TABS: 150.0000 mg | ORAL_TABLET | ORAL | 0 refills | Status: DC

## 2019-04-17 NOTE — Progress Notes (Signed)
Patient comes in today as new patient. She was seen by her GYN for vaginal itch. Patient stated that they told her it was jock itch or contact dermatitis. She is wanting second opinion.

## 2019-04-17 NOTE — Progress Notes (Signed)
HPI:      [name] is a 33 y.o. [id number] who LMP was No LMP recorded. Patient has had an ablation.  Subjective:   She presents today with complaint of vulvar itching discharge and vaginal bumps which have been present on and off for the last few months.  She has seen a physician for this on a number of occasions and was diagnosed with contact dermatitis, then yeast infection, then jock itch etc.  She has tried a number of different strategies including changing toilet tissue, bathing differently, antibiotic cream, and probiotics all without success.    Hx: The following portions of the patient's history were reviewed and updated as appropriate:             She  has a past medical history of Abnormal Pap smear, Allergic rhinitis, Anemia, Breast mass (07/26/2016), Diabetes mellitus without complication ([place]), Fracture of arm (CHILDHOOD), Fracture of wrist (CHILDHOOD), Gestational diabetes, Headache(784.0) (X 10 YEARS), Infection, PONV (postoperative nausea and vomiting), and Preterm labor (2008). She does not have any pertinent problems on file. She  has a past surgical history that includes Dilation and curettage of uterus (2011) and Laparoscopic tubal ligation (Bilateral, 12/01/2012). Her family history includes Arthritis in her maternal aunt; Asthma in her father, maternal grandmother, and sister; Birth defects in her cousin; Heart disease in her paternal grandmother; Hyperlipidemia in her mother; Hypertension in her paternal grandmother; Other in her mother; Stroke in her maternal grandfather. She  reports that she has never smoked. She has never used smokeless tobacco. She reports that she does not drink alcohol or use drugs. She has a current medication list which includes the following prescription(s): zyrtec allergy, nystatin ointment, triamcinolone cream, clobetasol cream, and fluconazole. She is allergic to ibuprofen.       Review of Systems:  Review of Systems  Constitutional:  Denied constitutional symptoms, night sweats, recent illness, fatigue, fever, insomnia and weight loss.  Eyes: Denied eye symptoms, eye pain, photophobia, vision change and visual disturbance.  Ears/Nose/Throat/Neck: Denied ear, nose, throat or neck symptoms, hearing loss, nasal discharge, sinus congestion and sore throat.  Cardiovascular: Denied cardiovascular symptoms, arrhythmia, chest pain/pressure, edema, exercise intolerance, orthopnea and palpitations.  Respiratory: Denied pulmonary symptoms, asthma, pleuritic pain, productive sputum, cough, dyspnea and wheezing.  Gastrointestinal: Denied, gastro-esophageal reflux, melena, nausea and vomiting.  Genitourinary: See HPI for additional information.  Musculoskeletal: Denied musculoskeletal symptoms, stiffness, swelling, muscle weakness and myalgia.  Dermatologic: Denied dermatology symptoms, rash and scar.  Neurologic: Denied neurology symptoms, dizziness, headache, neck pain and syncope.  Psychiatric: Denied psychiatric symptoms, anxiety and depression.  Endocrine: Denied endocrine symptoms including hot flashes and night sweats.   Meds:   Current Outpatient Medications on File Prior to Visit  Medication Sig Dispense Refill  . Cetirizine HCl (ZYRTEC ALLERGY) 10 MG CAPS Zyrtec    . nystatin ointment (MYCOSTATIN) nystatin 100,000 unit/gram topical ointment    . triamcinolone cream (KENALOG) 0.5 % Apply 1 application topically 2 (two) times daily.     No current facility-administered medications on file prior to visit.    Objective:     Vitals:   04/17/19 0808  BP: 118/83  Pulse: 76              Physical examination   Pelvic:   Vulva:  White adherent discharge noted, chronic irritation, erythema.  Vagina: No lesions or abnormalities noted.  Support: Normal pelvic support.  Urethra No masses tenderness or scarring.  Meatus Normal size without lesions  or prolapse.  Cervix: Normal appearance.  No lesions.  Anus: Normal exam.  No  lesions.  Perineum: Normal exam.  No lesions.        Bimanual   Uterus: Normal size.  Non-tender.  Mobile.  AV.  Adnexae: No masses.  Non-tender to palpation.  Cul-de-sac: Negative for abnormality.   WET PREP: clue cells: absent, KOH (yeast): positive, odor: absent and trichomoniasis: negative Ph:  < 4.5    Assessment:    [id number] Patient Active Problem List   Diagnosis Date Noted  . Gestational diabetes 10/29/2012  . Vaginal delivery 10/27/2012  . Hx of PTL (preterm labor), current pregnancy 04/29/2012  . Pregnant state, incidental 04/28/2012  . Allergic rhinitis      1. Monilial vulvovaginitis   2. 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  Her itching and subsequent scratching with no consistent strategy for treatment has worsened the issue.   Plan:            1.  We will treat with a month-long course of Diflucan.  Short-term course of clobetasol to eliminate the itch scratch cycle.  2.  To be complete we have sent the "super swab".  To rule out possible concurrent other types of infection Orders No orders of the defined types were placed in this encounter.    Meds ordered this encounter  Medications  . fluconazole (DIFLUCAN) 150 MG tablet    Sig: Take 1 tablet (150 mg total) by mouth once a week. Take one tablet Today and one Saturday then once per week after.    Dispense:  5 tablet    Refill:  0  . clobetasol cream (TEMOVATE) 0.05 %    Sig: Apply 1 application topically 2 (two) times daily. Use twice daily as directed.    Dispense:  15 g    Refill:  0      F/U  Return in about 4 weeks (around 05/15/2019). I spent 32 minutes involved in the care of this patient of which greater than 50% was spent discussing her history of vulvar issues including treatments, future treatment, current diagnosis.  All questions answered.  Elonda Husky, M.D. 04/17/2019 8:48 AM

## 2019-04-17 NOTE — Addendum Note (Signed)
Addended by: Durwin Glaze on: 04/17/2019 10:14 AM   Modules accepted: Orders

## 2019-04-18 LAB — CERVICOVAGINAL ANCILLARY ONLY
Chlamydia: NEGATIVE
Comment: NEGATIVE
Comment: NORMAL
Neisseria Gonorrhea: NEGATIVE

## 2019-05-15 ENCOUNTER — Other Ambulatory Visit: Payer: Self-pay

## 2019-05-15 ENCOUNTER — Ambulatory Visit (INDEPENDENT_AMBULATORY_CARE_PROVIDER_SITE_OTHER): Payer: 59 | Admitting: Obstetrics and Gynecology

## 2019-05-15 ENCOUNTER — Encounter: Payer: Self-pay | Admitting: Obstetrics and Gynecology

## 2019-05-15 VITALS — BP 126/81 | HR 73 | Ht 66.5 in | Wt 267.0 lb

## 2019-05-15 DIAGNOSIS — B373 Candidiasis of vulva and vagina: Secondary | ICD-10-CM | POA: Diagnosis not present

## 2019-05-15 DIAGNOSIS — B3731 Acute candidiasis of vulva and vagina: Secondary | ICD-10-CM

## 2019-05-15 DIAGNOSIS — L292 Pruritus vulvae: Secondary | ICD-10-CM

## 2019-05-15 NOTE — Progress Notes (Signed)
Patient comes in today for 4 week follow up for vulvar itching. Patient states that everything has cleared up.

## 2019-05-15 NOTE — Progress Notes (Signed)
HPI:      [name] is a 34 y.o. [phone] who LMP was No LMP recorded. Patient has had an ablation.  Subjective:   She presents today for follow-up of chronic vulvitis.  She reports that she is completely better.  She is not using any medications at this time.  She is obviously very happy with this result.    Hx: The following portions of the patient's history were reviewed and updated as appropriate:             She  has a past medical history of Abnormal Pap smear, Allergic rhinitis, Anemia, Breast mass (07/26/2016), Diabetes mellitus without complication (HCC), Fracture of arm (CHILDHOOD), Fracture of wrist (CHILDHOOD), Gestational diabetes, Headache(784.0) (X 10 YEARS), Infection, PONV (postoperative nausea and vomiting), and Preterm labor (2008). She does not have any pertinent problems on file. She  has a past surgical history that includes Dilation and curettage of uterus (2011) and Laparoscopic tubal ligation (Bilateral, 12/01/2012). Her family history includes Arthritis in her maternal aunt; Asthma in her father, maternal grandmother, and sister; Birth defects in her cousin; Heart disease in her paternal grandmother; Hyperlipidemia in her mother; Hypertension in her paternal grandmother; Other in her mother; Stroke in her maternal grandfather. She  reports that she has never smoked. She has never used smokeless tobacco. She reports that she does not drink alcohol or use drugs. She has a current medication list which includes the following prescription(s): zyrtec allergy, clobetasol cream, fluconazole, nystatin ointment, and triamcinolone cream. She is allergic to ibuprofen.       Review of Systems:  Review of Systems  Constitutional: Denied constitutional symptoms, night sweats, recent illness, fatigue, fever, insomnia and weight loss.  Eyes: Denied eye symptoms, eye pain, photophobia, vision change and visual disturbance.  Ears/Nose/Throat/Neck: Denied ear, nose, throat or neck  symptoms, hearing loss, nasal discharge, sinus congestion and sore throat.  Cardiovascular: Denied cardiovascular symptoms, arrhythmia, chest pain/pressure, edema, exercise intolerance, orthopnea and palpitations.  Respiratory: Denied pulmonary symptoms, asthma, pleuritic pain, productive sputum, cough, dyspnea and wheezing.  Gastrointestinal: Denied, gastro-esophageal reflux, melena, nausea and vomiting.  Genitourinary: Denied genitourinary symptoms including symptomatic vaginal discharge, pelvic relaxation issues, and urinary complaints.  Musculoskeletal: Denied musculoskeletal symptoms, stiffness, swelling, muscle weakness and myalgia.  Dermatologic: Denied dermatology symptoms, rash and scar.  Neurologic: Denied neurology symptoms, dizziness, headache, neck pain and syncope.  Psychiatric: Denied psychiatric symptoms, anxiety and depression.  Endocrine: Denied endocrine symptoms including hot flashes and night sweats.   Meds:   Current Outpatient Medications on File Prior to Visit  Medication Sig Dispense Refill  . Cetirizine HCl (ZYRTEC ALLERGY) 10 MG CAPS Zyrtec    . clobetasol cream (TEMOVATE) 0.05 % Apply 1 application topically 2 (two) times daily. Use twice daily as directed. (Patient not taking: Reported on 05/15/2019) 15 g 0  . fluconazole (DIFLUCAN) 150 MG tablet Take 1 tablet (150 mg total) by mouth once a week. Take one tablet Today and one Saturday then once per week after. (Patient not taking: Reported on 05/15/2019) 5 tablet 0  . nystatin ointment (MYCOSTATIN) nystatin 100,000 unit/gram topical ointment    . triamcinolone cream (KENALOG) 0.5 % Apply 1 application topically 2 (two) times daily.     No current facility-administered medications on file prior to visit.    Objective:     Vitals:   05/15/19 0837  BP: 126/81  Pulse: 73              As patient  completely better-no exam performed today.  Assessment:    [id number] Patient Active Problem List   Diagnosis Date  Noted  . Gestational diabetes 10/29/2012  . Vaginal delivery 10/27/2012  . Hx of PTL (preterm labor), current pregnancy 04/29/2012  . Pregnant state, incidental 04/28/2012  . Allergic rhinitis      1. Monilial vulvovaginitis   2. Vulvar itching     Completely resolved   Plan:            1.  Discussed future management of vulvitis.  Patient may try over-the-counter Monistat for 1 week.  If no better present to the office for testing and definitive diagnosis. Orders No orders of the defined types were placed in this encounter.   No orders of the defined types were placed in this encounter.     F/U  Return for Pt to contact us if symptoms worsen. I spent 12 minutes involved in the care of this patient preparing to see the patient by obtaining and reviewing her medical history (including labs, imaging tests and prior procedures), documenting clinical information in the electronic health record (EHR), writing and sending prescriptions, ordering tests or procedures and directly communicating with the patient discussing pertinent items from her history and physical exam as well as my assessment and plan as noted above.  All of her questions were answered.  Finis Bud, M.D. 05/15/2019 8:52 AM

## 2019-05-21 ENCOUNTER — Telehealth: Payer: Self-pay | Admitting: Obstetrics and Gynecology

## 2019-05-21 NOTE — Telephone Encounter (Signed)
Spoke with the patient and per Dr. Logan Bores last note she should try the monistat OTC for 7 days. If symptoms do not get better to schedule appointment a week out. Patient wanted to go ahead and schedule appointment just in case.

## 2019-05-21 NOTE — Telephone Encounter (Signed)
Pt is requesting a call from the nurse. Pt stated that she issue she has has returned. Please advise

## 2019-05-29 ENCOUNTER — Encounter: Payer: Self-pay | Admitting: Obstetrics and Gynecology

## 2019-05-29 ENCOUNTER — Other Ambulatory Visit: Payer: Self-pay

## 2019-05-29 ENCOUNTER — Ambulatory Visit (INDEPENDENT_AMBULATORY_CARE_PROVIDER_SITE_OTHER): Payer: 59 | Admitting: Obstetrics and Gynecology

## 2019-05-29 ENCOUNTER — Ambulatory Visit: Payer: 59 | Admitting: Obstetrics and Gynecology

## 2019-05-29 VITALS — BP 126/85 | HR 86 | Ht 66.5 in | Wt 265.0 lb

## 2019-05-29 DIAGNOSIS — L298 Other pruritus: Secondary | ICD-10-CM | POA: Diagnosis not present

## 2019-05-29 DIAGNOSIS — B3731 Acute candidiasis of vulva and vagina: Secondary | ICD-10-CM

## 2019-05-29 DIAGNOSIS — B373 Candidiasis of vulva and vagina: Secondary | ICD-10-CM | POA: Diagnosis not present

## 2019-05-29 MED ORDER — FLUCONAZOLE 150 MG PO TABS: 150.0000 mg | ORAL_TABLET | ORAL | 1 refills | Status: DC

## 2019-05-29 NOTE — Progress Notes (Signed)
HPI:      [name] is a 34 y.o. [phone] who LMP was No LMP recorded. Patient has had an ablation.  Subjective:   She presents today complaining of vulvar itching consistent with previous yeast infection.  She has tried Monistat vaginal cream and her symptoms have not resolved.  She last use the cream on Sunday. Of significant note patient has had chronic vulva vaginal yeast infection in the past which resolved with longer term Diflucan use.    Hx: The following portions of the patient's history were reviewed and updated as appropriate:             She  has a past medical history of Abnormal Pap smear, Allergic rhinitis, Anemia, Breast mass (07/26/2016), Diabetes mellitus without complication (HCC), Fracture of arm (CHILDHOOD), Fracture of wrist (CHILDHOOD), Gestational diabetes, Headache(784.0) (X 10 YEARS), Infection, PONV (postoperative nausea and vomiting), and Preterm labor (2008). She does not have any pertinent problems on file. She  has a past surgical history that includes Dilation and curettage of uterus (2011) and Laparoscopic tubal ligation (Bilateral, 12/01/2012). Her family history includes Arthritis in her maternal aunt; Asthma in her father, maternal grandmother, and sister; Birth defects in her cousin; Heart disease in her paternal grandmother; Hyperlipidemia in her mother; Hypertension in her paternal grandmother; Other in her mother; Stroke in her maternal grandfather. She  reports that she has never smoked. She has never used smokeless tobacco. She reports that she does not drink alcohol or use drugs. She has a current medication list which includes the following prescription(s): zyrtec allergy, nystatin ointment, triamcinolone cream, and fluconazole. She is allergic to ibuprofen.       Review of Systems:  Review of Systems  Constitutional: Denied constitutional symptoms, night sweats, recent illness, fatigue, fever, insomnia and weight loss.  Eyes: Denied eye  symptoms, eye pain, photophobia, vision change and visual disturbance.  Ears/Nose/Throat/Neck: Denied ear, nose, throat or neck symptoms, hearing loss, nasal discharge, sinus congestion and sore throat.  Cardiovascular: Denied cardiovascular symptoms, arrhythmia, chest pain/pressure, edema, exercise intolerance, orthopnea and palpitations.  Respiratory: Denied pulmonary symptoms, asthma, pleuritic pain, productive sputum, cough, dyspnea and wheezing.  Gastrointestinal: Denied, gastro-esophageal reflux, melena, nausea and vomiting.  Genitourinary: See HPI for additional information.  Musculoskeletal: Denied musculoskeletal symptoms, stiffness, swelling, muscle weakness and myalgia.  Dermatologic: Denied dermatology symptoms, rash and scar.  Neurologic: Denied neurology symptoms, dizziness, headache, neck pain and syncope.  Psychiatric: Denied psychiatric symptoms, anxiety and depression.  Endocrine: Denied endocrine symptoms including hot flashes and night sweats.   Meds:   Current Outpatient Medications on File Prior to Visit  Medication Sig Dispense Refill  . Cetirizine HCl (ZYRTEC ALLERGY) 10 MG CAPS Zyrtec    . nystatin ointment (MYCOSTATIN) nystatin 100,000 unit/gram topical ointment    . triamcinolone cream (KENALOG) 0.5 % Apply 1 application topically 2 (two) times daily.     No current facility-administered medications on file prior to visit.    Objective:     Vitals:   05/29/19 1527  BP: 126/85  Pulse: 86              Physical examination   Pelvic:  Vulva:  Erythematous and irritated appearing  Vagina: No lesions or abnormalities noted.  Thick white vaginal discharge  Support: Normal pelvic support.  Urethra No masses tenderness or scarring.  Meatus Normal size without lesions or prolapse.  Cervix: Normal appearance.  No lesions.  Anus: Normal exam.  No lesions.  Perineum: Normal  exam.  No lesions.   WET PREP: clue cells: present, KOH (yeast): positive, odor:  absent and trichomoniasis: negative Ph:  < 4.5   Assessment:    [id number] Patient Active Problem List   Diagnosis Date Noted  . Gestational diabetes 10/29/2012  . Vaginal delivery 10/27/2012  . Hx of PTL (preterm labor), current pregnancy 04/29/2012  . Pregnant state, incidental 04/28/2012  . Allergic rhinitis      1. Monilial vulvovaginitis        Plan:            1.  We will treat with 3 doses of Diflucan.  Expect prompt resolution.  Use of probiotics discussed.  Orders No orders of the defined types were placed in this encounter.    Meds ordered this encounter  Medications  . fluconazole (DIFLUCAN) 150 MG tablet    Sig: Take 1 tablet (150 mg total) by mouth every 3 (three) days. For three doses    Dispense:  3 tablet    Refill:  1      F/U  Return for Pt to contact us if symptoms worsen. I spent 22 minutes involved in the care of this patient preparing to see the patient by obtaining and reviewing her medical history (including labs, imaging tests and prior procedures), documenting clinical information in the electronic health record (EHR), counseling and coordinating care plans, writing and sending prescriptions, ordering tests or procedures and directly communicating with the patient by discussing pertinent items from her history and physical exam as well as detailing my assessment and plan as noted above so that she has an informed understanding.  All of her questions were answered.  Finis Bud, M.D. 05/29/2019 3:52 PM

## 2020-04-02 ENCOUNTER — Other Ambulatory Visit: Payer: Self-pay

## 2020-04-02 ENCOUNTER — Ambulatory Visit (INDEPENDENT_AMBULATORY_CARE_PROVIDER_SITE_OTHER): Payer: 59 | Admitting: Obstetrics and Gynecology

## 2020-04-02 ENCOUNTER — Encounter: Payer: Self-pay | Admitting: Obstetrics and Gynecology

## 2020-04-02 VITALS — BP 116/83 | HR 80 | Ht 66.5 in | Wt 251.9 lb

## 2020-04-02 DIAGNOSIS — B373 Candidiasis of vulva and vagina: Secondary | ICD-10-CM

## 2020-04-02 DIAGNOSIS — B3731 Acute candidiasis of vulva and vagina: Secondary | ICD-10-CM

## 2020-04-02 MED ORDER — FLUCONAZOLE 150 MG PO TABS: 150.0000 mg | ORAL_TABLET | ORAL | 0 refills | Status: DC

## 2020-04-02 NOTE — Progress Notes (Signed)
HPI:      [name] is a 34 y.o. [phone] who LMP was No LMP recorded. Patient has had an ablation.  Subjective:   She presents today complaining of what she believes is another yeast infection.  She states her husband got a new soap and when this happens she often gets yeast infections.  She has had multiple yeast infections over the last year.  These cleared up with Diflucan each time.    Hx: The following portions of the patient's history were reviewed and updated as appropriate:             She  has a past medical history of Abnormal Pap smear, Allergic rhinitis, Anemia, Breast mass (07/26/2016), Diabetes mellitus without complication (HCC), Fracture of arm (CHILDHOOD), Fracture of wrist (CHILDHOOD), Gestational diabetes, Headache(784.0) (X 10 YEARS), Infection, PONV (postoperative nausea and vomiting), and Preterm labor (2008). She does not have any pertinent problems on file. She  has a past surgical history that includes Dilation and curettage of uterus (2011) and Laparoscopic tubal ligation (Bilateral, 12/01/2012). Her family history includes Arthritis in her maternal aunt; Asthma in her father, maternal grandmother, and sister; Birth defects in her cousin; Heart disease in her paternal grandmother; Hyperlipidemia in her mother; Hypertension in her paternal grandmother; Other in her mother; Stroke in her maternal grandfather. She  reports that she has never smoked. She has never used smokeless tobacco. She reports that she does not drink alcohol and does not use drugs. She has a current medication list which includes the following prescription(s): zyrtec allergy and fluconazole. She is allergic to ibuprofen.       Review of Systems:  Review of Systems  Constitutional: Denied constitutional symptoms, night sweats, recent illness, fatigue, fever, insomnia and weight loss.  Eyes: Denied eye symptoms, eye pain, photophobia, vision change and visual disturbance.  Ears/Nose/Throat/Neck:  Denied ear, nose, throat or neck symptoms, hearing loss, nasal discharge, sinus congestion and sore throat.  Cardiovascular: Denied cardiovascular symptoms, arrhythmia, chest pain/pressure, edema, exercise intolerance, orthopnea and palpitations.  Respiratory: Denied pulmonary symptoms, asthma, pleuritic pain, productive sputum, cough, dyspnea and wheezing.  Gastrointestinal: Denied, gastro-esophageal reflux, melena, nausea and vomiting.  Genitourinary: See HPI for additional information.  Musculoskeletal: Denied musculoskeletal symptoms, stiffness, swelling, muscle weakness and myalgia.  Dermatologic: Denied dermatology symptoms, rash and scar.  Neurologic: Denied neurology symptoms, dizziness, headache, neck pain and syncope.  Psychiatric: Denied psychiatric symptoms, anxiety and depression.  Endocrine: Denied endocrine symptoms including hot flashes and night sweats.   Meds:   Current Outpatient Medications on File Prior to Visit  Medication Sig Dispense Refill  . Cetirizine HCl (ZYRTEC ALLERGY) 10 MG CAPS Zyrtec     No current facility-administered medications on file prior to visit.          Objective:     Vitals:   04/02/20 1533  BP: 116/83  Pulse: 80   Filed Weights   04/02/20 1533  Weight: 251 lb 14.4 oz (114.3 kg)              Physical examination   Pelvic:   Vulva: Normal appearance.  No lesions.  Vagina: No lesions or abnormalities noted.  Support: Normal pelvic support.  Urethra No masses tenderness or scarring.  Meatus Normal size without lesions or prolapse.  Cervix: Normal appearance.  No lesions.  Anus: Normal exam.  No lesions.  Perineum: Normal exam.  No lesions.  WET PREP: clue cells: absent, KOH (yeast): positive, odor: absent and trichomoniasis: negative  Ph:  < 4.5    Assessment:    [id number] Patient Active Problem List   Diagnosis Date Noted  . Gestational diabetes 10/29/2012  . Vaginal delivery 10/27/2012  . Hx of PTL (preterm labor),  current pregnancy 04/29/2012  . Pregnant state, incidental 04/28/2012  . Allergic rhinitis      1. Monilial vulvovaginitis        Plan:            1.  Treat with Diflucan.  Discussed use of probiotics on a regular basis.  Fragrance free soaps and other body washes. Orders No orders of the defined types were placed in this encounter.    Meds ordered this encounter  Medications  . fluconazole (DIFLUCAN) 150 MG tablet    Sig: Take 1 tablet (150 mg total) by mouth every 3 (three) days. For two doses    Dispense:  4 tablet    Refill:  0      F/U  Return for Pt to contact us if symptoms worsen. I spent 22 minutes involved in the care of this patient preparing to see the patient by obtaining and reviewing her medical history (including labs, imaging tests and prior procedures), documenting clinical information in the electronic health record (EHR), counseling and coordinating care plans, writing and sending prescriptions, ordering tests or procedures and directly communicating with the patient by discussing pertinent items from her history and physical exam as well as detailing my assessment and plan as noted above so that she has an informed understanding.  All of her questions were answered.  Elonda Husky, M.D. 04/02/2020 4:29 PM

## 2020-04-29 ENCOUNTER — Ambulatory Visit (INDEPENDENT_AMBULATORY_CARE_PROVIDER_SITE_OTHER): Payer: 59 | Admitting: Obstetrics and Gynecology

## 2020-04-29 ENCOUNTER — Other Ambulatory Visit: Payer: Self-pay

## 2020-04-29 ENCOUNTER — Encounter: Payer: Self-pay | Admitting: Obstetrics and Gynecology

## 2020-04-29 VITALS — BP 122/84 | HR 87 | Ht 66.5 in | Wt 250.2 lb

## 2020-04-29 DIAGNOSIS — B373 Candidiasis of vulva and vagina: Secondary | ICD-10-CM

## 2020-04-29 DIAGNOSIS — B3731 Acute candidiasis of vulva and vagina: Secondary | ICD-10-CM

## 2020-04-29 MED ORDER — FLUCONAZOLE 150 MG PO TABS: 150.0000 mg | ORAL_TABLET | Freq: Once | ORAL | 3 refills | Status: AC

## 2020-04-29 NOTE — Progress Notes (Signed)
HPI:      [name] is a 35 y.o. [phone] who LMP was No LMP recorded. Patient has had an ablation.  Subjective:   She presents today stating that after her last vaginitis and use of Diflucan it cleared up.  She was using probiotics successfully without recurrences.  She ran out of her probiotics and she began to have symptoms again.  She has not had symptoms approximately 3 weeks and presents with the complaint of discharge and significant vulvar itching.    Hx: The following portions of the patient's history were reviewed and updated as appropriate:             She  has a past medical history of Abnormal Pap smear, Allergic rhinitis, Anemia, Breast mass (07/26/2016), Diabetes mellitus without complication (HCC), Fracture of arm (CHILDHOOD), Fracture of wrist (CHILDHOOD), Gestational diabetes, Headache(784.0) (X 10 YEARS), Infection, PONV (postoperative nausea and vomiting), and Preterm labor (2008). She does not have any pertinent problems on file. She  has a past surgical history that includes Dilation and curettage of uterus (2011) and Laparoscopic tubal ligation (Bilateral, 12/01/2012). Her family history includes Arthritis in her maternal aunt; Asthma in her father, maternal grandmother, and sister; Birth defects in her cousin; Heart disease in her paternal grandmother; Hyperlipidemia in her mother; Hypertension in her paternal grandmother; Other in her mother; Stroke in her maternal grandfather. She  reports that she has never smoked. She has never used smokeless tobacco. She reports that she does not drink alcohol and does not use drugs. She has a current medication list which includes the following prescription(s): zyrtec allergy and fluconazole. She is allergic to ibuprofen.       Review of Systems:  Review of Systems  Constitutional: Denied constitutional symptoms, night sweats, recent illness, fatigue, fever, insomnia and weight loss.  Eyes: Denied eye symptoms, eye pain,  photophobia, vision change and visual disturbance.  Ears/Nose/Throat/Neck: Denied ear, nose, throat or neck symptoms, hearing loss, nasal discharge, sinus congestion and sore throat.  Cardiovascular: Denied cardiovascular symptoms, arrhythmia, chest pain/pressure, edema, exercise intolerance, orthopnea and palpitations.  Respiratory: Denied pulmonary symptoms, asthma, pleuritic pain, productive sputum, cough, dyspnea and wheezing.  Gastrointestinal: Denied, gastro-esophageal reflux, melena, nausea and vomiting.  Genitourinary: See HPI for additional information.  Musculoskeletal: Denied musculoskeletal symptoms, stiffness, swelling, muscle weakness and myalgia.  Dermatologic: Denied dermatology symptoms, rash and scar.  Neurologic: Denied neurology symptoms, dizziness, headache, neck pain and syncope.  Psychiatric: Denied psychiatric symptoms, anxiety and depression.  Endocrine: Denied endocrine symptoms including hot flashes and night sweats.   Meds:   Current Outpatient Medications on File Prior to Visit  Medication Sig Dispense Refill  . Cetirizine HCl (ZYRTEC ALLERGY) 10 MG CAPS Zyrtec     No current facility-administered medications on file prior to visit.      .     Objective:     Vitals:   04/29/20 1540  BP: 122/84  Pulse: 87   Filed Weights   04/29/20 1540  Weight: 250 lb 3.2 oz (113.5 kg)              Physical examination   Pelvic:   Vulva:  Significant erythema bilaterally.  Thick white vaginal discharge.  Edema.  Vagina: No lesions or abnormalities noted.  Support: Normal pelvic support.  Urethra No masses tenderness or scarring.  Meatus Normal size without lesions or prolapse.     Anus: Normal exam.  No lesions.  Perineum: Normal exam.  No lesions.  Assessment:    [id number] Patient Active Problem List   Diagnosis Date Noted  . Gestational diabetes 10/29/2012  . Vaginal delivery 10/27/2012  . Hx of PTL (preterm labor), current pregnancy 04/29/2012   . Pregnant state, incidental 04/28/2012  . Allergic rhinitis      1. Monilial vulvovaginitis     At this point it is almost chronic because it has been present approximately 3 weeks.   Plan:            1.  We will treat for 4 doses of Diflucan.  Patient will restart probiotics. Orders No orders of the defined types were placed in this encounter.    Meds ordered this encounter  Medications  . fluconazole (DIFLUCAN) 150 MG tablet    Sig: Take 1 tablet (150 mg total) by mouth once for 1 dose. Take as directed.  Today, Friday, Friday, Friday    Dispense:  4 tablet    Refill:  3      F/U  Return for Pt to contact us if symptoms worsen. I spent 21 minutes involved in the care of this patient preparing to see the patient by obtaining and reviewing her medical history (including labs, imaging tests and prior procedures), documenting clinical information in the electronic health record (EHR), counseling and coordinating care plans, writing and sending prescriptions, ordering tests or procedures and directly communicating with the patient by discussing pertinent items from her history and physical exam as well as detailing my assessment and plan as noted above so that she has an informed understanding.  All of her questions were answered.  Elonda Husky, M.D. 04/29/2020 4:06 PM

## 2020-11-11 ENCOUNTER — Encounter: Payer: Self-pay | Admitting: Obstetrics and Gynecology

## 2020-11-11 ENCOUNTER — Other Ambulatory Visit (HOSPITAL_COMMUNITY)
Admission: RE | Admit: 2020-11-11 | Discharge: 2020-11-11 | Disposition: A | Discharge status: Home / Self Care | Payer: 59 | Source: Ambulatory Visit | Attending: Obstetrics and Gynecology | Admitting: Obstetrics and Gynecology

## 2020-11-11 ENCOUNTER — Ambulatory Visit (INDEPENDENT_AMBULATORY_CARE_PROVIDER_SITE_OTHER): Payer: 59 | Admitting: Obstetrics and Gynecology

## 2020-11-11 ENCOUNTER — Other Ambulatory Visit: Payer: Self-pay

## 2020-11-11 VITALS — BP 114/74 | HR 67 | Ht 66.0 in | Wt 216.1 lb

## 2020-11-11 DIAGNOSIS — N923 Ovulation bleeding: Secondary | ICD-10-CM | POA: Diagnosis not present

## 2020-11-11 DIAGNOSIS — Z01419 Encounter for gynecological examination (general) (routine) without abnormal findings: Secondary | ICD-10-CM | POA: Diagnosis not present

## 2020-11-11 DIAGNOSIS — Z124 Encounter for screening for malignant neoplasm of cervix: Secondary | ICD-10-CM | POA: Diagnosis not present

## 2020-11-11 NOTE — Progress Notes (Signed)
HPI:      [name] is a 35 y.o. [phone] who LMP was No LMP recorded. Patient has had an ablation.  Subjective:   She presents today for her annual examination.  She is generally doing well.  No further vulvar irritation/yeast infection.   She has had an endometrial ablation  she does state that in addition to her monthly spotting she occasionally has intermenstrual spotting.  She also occasionally has postcoital spotting.    Hx: The following portions of the patient's history were reviewed and updated as appropriate:             She  has a past medical history of Abnormal Pap smear, Allergic rhinitis, Anemia, Breast mass (07/26/2016), Diabetes mellitus without complication (HCC), Fracture of arm (CHILDHOOD), Fracture of wrist (CHILDHOOD), Gestational diabetes, Headache(784.0) (X 10 YEARS), Infection, PONV (postoperative nausea and vomiting), and Preterm labor (2008). She does not have any pertinent problems on file. She  has a past surgical history that includes Dilation and curettage of uterus (04/26/2009); Laparoscopic tubal ligation (Bilateral, 12/01/2012); and Ablation. Her family history includes Arthritis in her maternal aunt; Asthma in her father, maternal grandmother, and sister; Birth defects in her cousin; Heart disease in her paternal grandmother; Hyperlipidemia in her mother; Hypertension in her paternal grandmother; Other in her mother; Stroke in her maternal grandfather. She  reports that she has never smoked. She has never used smokeless tobacco. She reports that she does not drink alcohol and does not use drugs. She has a current medication list which includes the following prescription(s): zyrtec allergy. She is allergic to ibuprofen.       Review of Systems:  Review of Systems  Constitutional: Denied constitutional symptoms, night sweats, recent illness, fatigue, fever, insomnia and weight loss.  Eyes: Denied eye symptoms, eye pain, photophobia, vision change and  visual disturbance.  Ears/Nose/Throat/Neck: Denied ear, nose, throat or neck symptoms, hearing loss, nasal discharge, sinus congestion and sore throat.  Cardiovascular: Denied cardiovascular symptoms, arrhythmia, chest pain/pressure, edema, exercise intolerance, orthopnea and palpitations.  Respiratory: Denied pulmonary symptoms, asthma, pleuritic pain, productive sputum, cough, dyspnea and wheezing.  Gastrointestinal: Denied, gastro-esophageal reflux, melena, nausea and vomiting.  Genitourinary: Denied genitourinary symptoms including symptomatic vaginal discharge, pelvic relaxation issues, and urinary complaints.  Musculoskeletal: Denied musculoskeletal symptoms, stiffness, swelling, muscle weakness and myalgia.  Dermatologic: Denied dermatology symptoms, rash and scar.  Neurologic: Denied neurology symptoms, dizziness, headache, neck pain and syncope.  Psychiatric: Denied psychiatric symptoms, anxiety and depression.  Endocrine: Denied endocrine symptoms including hot flashes and night sweats.   Meds:   Current Outpatient Medications on File Prior to Visit  Medication Sig Dispense Refill   Cetirizine HCl (ZYRTEC ALLERGY) 10 MG CAPS Zyrtec     No current facility-administered medications on file prior to visit.       Objective:     Vitals:   11/11/20 1337  BP: 114/74  Pulse: 67    Filed Weights   11/11/20 1337  Weight: 216 lb 1.6 oz (98 kg)              Physical examination General NAD, Conversant  HEENT Atraumatic; Op clear with mmm.  Normo-cephalic. Pupils reactive. Anicteric sclerae  Thyroid/Neck Smooth without nodularity or enlargement. Normal ROM.  Neck Supple.  Skin No rashes, lesions or ulceration. Normal palpated skin turgor. No nodularity.  Breasts: No masses or discharge.  Symmetric.  No axillary adenopathy.  Lungs: Clear to auscultation.No rales or wheezes. Normal Respiratory effort, no retractions.  Heart: NSR.  No murmurs or rubs appreciated. No periferal  edema  Abdomen: Soft.  Non-tender.  No masses.  No HSM. No hernia  Extremities: Moves all appropriately.  Normal ROM for age. No lymphadenopathy.  Neuro: Oriented to PPT.  Normal mood. Normal affect.     Pelvic:   Vulva: Normal appearance.  No lesions.  Vagina: No lesions or abnormalities noted.  Support: Normal pelvic support.  Urethra No masses tenderness or scarring.  Meatus Normal size without lesions or prolapse.  Cervix: Large friable ectropion.  Bleeds very easily.  Anus: Normal exam.  No lesions.  Perineum: Normal exam.  No lesions.        Bimanual   Uterus: Normal size.  Non-tender.  Mobile.  AV.  Adnexae: No masses.  Non-tender to palpation.  Cul-de-sac: Negative for abnormality.   Silver nitrate applied to friable area of the cervix sclerosis noted  Assessment:    [id number] Patient Active Problem List   Diagnosis Date Noted   Gestational diabetes 10/29/2012   Vaginal delivery 10/27/2012   Hx of PTL (preterm labor), current pregnancy 04/29/2012   Pregnant state, incidental 04/28/2012   Allergic rhinitis      1. Well woman exam with routine gynecological exam   2. Screening for cervical cancer   3. Intermenstrual bleeding     Intermenstrual bleeding and postcoital bleeding likely secondary to ectropion/glandular cervical bleeding.   Plan:            1.  Basic Screening Recommendations The basic screening recommendations for asymptomatic women were discussed with the patient during her visit.  The age-appropriate recommendations were discussed with her and the rational for the tests reviewed.  When I am informed by the patient that another primary care physician has previously obtained the age-appropriate tests and they are up-to-date, only outstanding tests are ordered and referrals given as necessary.  Abnormal results of tests will be discussed with her when all of her results are completed.  Routine preventative health maintenance measures emphasized:  Exercise/Diet/Weight control, Tobacco Warnings, Alcohol/Substance use risks and Stress Management Pap performed 2.  Intermenstrual bleeding likely secondary to ectropion-possibly helped with silver nitrate sclerosis. I have reassured her regarding this finding-I doubt this is clinically significant other than the small amount of bleeding.  Orders No orders of the defined types were placed in this encounter.   No orders of the defined types were placed in this encounter.           F/U  Return in about 1 year (around 11/11/2021) for Annual Physical.  Elonda Husky, M.D. 11/11/2020 2:06 PM

## 2020-11-17 LAB — CYTOLOGY - PAP
Comment: NEGATIVE
Diagnosis: UNDETERMINED — AB
High risk HPV: NEGATIVE

## 2020-12-10 LAB — HM DIABETES EYE EXAM

## 2021-06-22 ENCOUNTER — Ambulatory Visit: Payer: 59 | Admitting: Family

## 2021-07-06 ENCOUNTER — Encounter: Payer: Self-pay | Admitting: Family

## 2021-07-06 ENCOUNTER — Ambulatory Visit (INDEPENDENT_AMBULATORY_CARE_PROVIDER_SITE_OTHER): Payer: 59 | Admitting: Family

## 2021-07-06 VITALS — BP 121/80 | HR 68 | Temp 97.9°F | Ht 66.0 in | Wt 242.4 lb

## 2021-07-06 DIAGNOSIS — Z0001 Encounter for general adult medical examination with abnormal findings: Secondary | ICD-10-CM

## 2021-07-06 DIAGNOSIS — G43009 Migraine without aura, not intractable, without status migrainosus: Secondary | ICD-10-CM | POA: Diagnosis not present

## 2021-07-06 LAB — TSH: TSH: 1.37 u[IU]/mL (ref 0.35–5.50)

## 2021-07-06 LAB — CBC WITH DIFFERENTIAL/PLATELET
Basophils Absolute: 0 10*3/uL (ref 0.0–0.1)
Basophils Relative: 0.5 % (ref 0.0–3.0)
Eosinophils Absolute: 0.1 10*3/uL (ref 0.0–0.7)
Eosinophils Relative: 1.2 % (ref 0.0–5.0)
HCT: 40.2 % (ref 36.0–46.0)
Hemoglobin: 13.3 g/dL (ref 12.0–15.0)
Lymphocytes Relative: 37.4 % (ref 12.0–46.0)
Lymphs Abs: 2 10*3/uL (ref 0.7–4.0)
MCHC: 33 g/dL (ref 30.0–36.0)
MCV: 83.3 fl (ref 78.0–100.0)
Monocytes Absolute: 0.3 10*3/uL (ref 0.1–1.0)
Monocytes Relative: 6.1 % (ref 3.0–12.0)
Neutro Abs: 2.9 10*3/uL (ref 1.4–7.7)
Neutrophils Relative %: 54.8 % (ref 43.0–77.0)
Platelets: 300 10*3/uL (ref 150.0–400.0)
RBC: 4.83 Mil/uL (ref 3.87–5.11)
RDW: 13.7 % (ref 11.5–15.5)
WBC: 5.3 10*3/uL (ref 4.0–10.5)

## 2021-07-06 LAB — COMPREHENSIVE METABOLIC PANEL
ALT: 12 U/L (ref 0–35)
AST: 16 U/L (ref 0–37)
Albumin: 4.3 g/dL (ref 3.5–5.2)
Alkaline Phosphatase: 44 U/L (ref 39–117)
BUN: 8 mg/dL (ref 6–23)
CO2: 25 mEq/L (ref 19–32)
Calcium: 9.3 mg/dL (ref 8.4–10.5)
Chloride: 104 mEq/L (ref 96–112)
Creatinine, Ser: 0.61 mg/dL (ref 0.40–1.20)
GFR: 113.8 mL/min (ref >60.00)
Glucose, Bld: 109 mg/dL — ABNORMAL HIGH (ref 70–99)
Potassium: 3.8 mEq/L (ref 3.5–5.1)
Sodium: 136 mEq/L (ref 135–145)
Total Bilirubin: 0.6 mg/dL (ref 0.2–1.2)
Total Protein: 7 g/dL (ref 6.0–8.3)

## 2021-07-06 LAB — LIPID PANEL
Cholesterol: 191 mg/dL (ref 0–200)
HDL: 70 mg/dL (ref >39.00)
LDL Cholesterol: 109 mg/dL — ABNORMAL HIGH (ref 0–99)
NonHDL: 121.25
Total CHOL/HDL Ratio: 3
Triglycerides: 62 mg/dL (ref 0.0–149.0)
VLDL: 12.4 mg/dL (ref 0.0–40.0)

## 2021-07-06 NOTE — Patient Instructions (Addendum)
Welcome to [facility]! It was a pleasure meeting you today. ? ?Go to the lab for blood work today. ? ?As discussed, for your sinus headaches, increase the Flonase nasal spray to twice a day, continue the Zyrtec daily, or can try generic Xyzal, 1 pill daily or twice a day. Also try using a saline nasal spray several times a day (or Neti pot) to flush your sinuses and disinfect. See the handout attached. ?Let me know if the headaches continue as we may need to try other options. ? ?Check with your insurance company to see if any weight loss meds are covered (not for diabetes) e.g.: Ozempic, Wegovy, Saxenda, Victoza, or Trulicity - all injectables. ? ? ? ? ?PLEASE NOTE: ? ?If you had any LAB tests please let us know if you have not heard back within a few days. You may see your results on MyChart before we have a chance to review them but we will give you a call once they are reviewed by [place]. If we ordered any REFERRALS today, please let us know if you have not heard from their office within the next week.  ?Let us know through MyChart if you are needing REFILLS, or have your pharmacy send [place] the request. You can also use MyChart to communicate with me or any office staff. ? ?Please try these tips to maintain a healthy lifestyle: ? ?Eat most of your calories during the day when you are active. Eliminate processed foods including packaged sweets (pies, cakes, cookies), reduce intake of potatoes, white bread, white pasta, and white rice. Look for whole grain options, oat flour or almond flour. ? ?Each meal should contain half fruits/vegetables, one quarter protein, and one quarter carbs (no bigger than a computer mouse). ? ?Cut down on sweet beverages. This includes juice, soda, and sweet tea. Also watch fruit intake, though this is a healthier sweet option, it still contains natural sugar! Limit to 3 servings daily. ? ?Drink at least 1 glass of water with each meal and aim for at least 8  glasses per day ? ?Exercise at least 150 minutes every week.  ? ?

## 2021-07-06 NOTE — Assessment & Plan Note (Signed)
reports having sinus headaches with change of season and allergy seasons, but sometimes they turn into migraines. Pt reports infrequently, but she has to lie down. Usually she takes Tylenol and drinks coffee for the caffeine which eases the HA. Advised she increase the Flonase nasal spray to twice a day, continue the Zyrtec daily, or switch to generic Xyzal, 1 pill daily or twice a day. Also a saline nasal spray several times a day (or Neti pot). ?

## 2021-07-06 NOTE — Progress Notes (Signed)
?Phone [phone] ?  ?Subjective:  ? ?Patient is a 36 y.o. female presenting for annual physical.   ? ?Chief Complaint  ?Patient presents with  ?? Annual Exam  ?  Fasting w/ Labs  ?? Migraine  ?  Pt c/o migraines and sinus headaches for years. Has tried tylenol for sinus headaches but not her migraines.   ? ?Headache: Patient complains of headache. She does not have a headache at this time.  ?Location of pain: frontal  Character of pain:aching and dull. Accompanying symptoms: none Prodromal sx?: diplopia. Rapidity of onset: gradual. Typical duration of individual headache: a few hours. Are most headaches similar in presentation? yes Typical precipitants: stress, odors. ?Seasonal pattern?: yes - spring , fall. Abortive meds? acetaminophen and drinking coffee. Daily use? no. Prophylactic meds? no. ?History of head/neck trauma? no. Use of meds that might worsen HAs? no. Exposure to carbon monoxide? no. Caffeine use: yes, just with migraine. Reports having mostly sinus HA, and a migraine every 2-84mos. ? ? ?See problem oriented charting- ?ROS- full  review of systems was completed and negative ?except for headaches noted in HPI above. ? ?The following were reviewed and entered/updated in epic: ?Past Medical History:  ?Diagnosis Date  ?? Abnormal Pap smear   ? LAST PAPP 04/2011  ?? Allergic rhinitis   ?? Anemia   ? CHRONIC  ?? Breast mass 07/26/2016  ? rt lump near lt axilla  ?? Diabetes mellitus without complication (HCC)   ?? Fracture of arm CHILDHOOD  ?? Fracture of wrist CHILDHOOD  ?? Gestational diabetes   ? glyburide  ?? Headache(784.0) X 10 YEARS  ? MIGRAINES; OTC MED  ?? Hx of PTL (preterm labor), current pregnancy 04/29/2012  ? 2nd pg PTL at 32 week del at 38 weeks  ?? Infection   ? UTI X 1  ?? PONV (postoperative nausea and vomiting)   ?? Pregnant state, incidental 04/28/2012  ?? Preterm labor 2008  ?? Vaginal delivery 10/27/2012  ? ?Patient Active Problem List  ? Diagnosis Date Noted  ?? Migraine without aura and  without status migrainosus, not intractable 07/06/2021  ?? Gestational diabetes 10/29/2012  ?? Allergic rhinitis   ? ?Past Surgical History:  ?Procedure Laterality Date  ?? ABLATION    ?? DILATION AND CURETTAGE OF UTERUS  04/26/2009  ?? LAPAROSCOPIC TUBAL LIGATION Bilateral 12/01/2012  ? Procedure: LAPAROSCOPIC TUBAL LIGATION;  Surgeon: Kirkland Hun, MD;  Location: [facility];  Service: Gynecology;  Laterality: Bilateral;  ? ? ?Family History  ?Problem Relation Age of Onset  ?? Hyperlipidemia Mother   ?? Other Mother   ?     VOMITING WITH ANESTHESIA  ?? Asthma Father   ?? Asthma Sister   ?? Asthma Maternal Grandmother   ?? Stroke Maternal Grandfather   ?? Hypertension Paternal Grandmother   ?? Heart disease Paternal Grandmother   ?? Arthritis Maternal Aunt   ?     2 maternal aunts - Rheumatoid Arthritis  ?? Birth defects Cousin   ?     SPINA BIFIDA  ? ? ?Medications- reviewed and updated ?Current Outpatient Medications  ?Medication Sig Dispense Refill  ?? acetaminophen (TYLENOL) 500 MG tablet Take 1,000 mg by mouth as needed for headache.    ?? Cetirizine HCl (ZYRTEC ALLERGY) 10 MG CAPS 10 mg daily.    ? ?No current facility-administered medications for this visit.  ? ? ?Allergies-reviewed and updated ?Allergies  ?Allergen Reactions  ?? Ibuprofen Hives  ? ? ?Social History  ? ?Social History Narrative  ??  Not on file  ? ?Objective  ?Objective:  ?BP 121/80 (BP Location: Left Arm, Patient Position: Sitting, Cuff Size: Large)   Pulse 68   Temp 97.9 ?F (36.6 ?C) (Temporal)   Ht 5\' 6"  (1.676 m)   Wt 242 lb 6.4 oz (110 kg)   SpO2 100%   BMI 39.12 kg/m?  ?Physical Exam ?Vitals and nursing note reviewed.  ?Constitutional:   ?   Appearance: Normal appearance.  ?HENT:  ?   Head: Normocephalic.  ?   Right Ear: Tympanic membrane normal.  ?   Left Ear: Tympanic membrane normal.  ?   Nose: Nose normal.  ?   Mouth/Throat:  ?   Mouth: Mucous membranes are moist.  ?Eyes:  ?   Pupils: Pupils are equal, round, and reactive to  light.  ?Cardiovascular:  ?   Rate and Rhythm: Normal rate and regular rhythm.  ?Pulmonary:  ?   Effort: Pulmonary effort is normal.  ?   Breath sounds: Normal breath sounds.  ?Musculoskeletal:     ?   General: Normal range of motion.  ?   Cervical back: Normal range of motion.  ?Lymphadenopathy:  ?   Cervical: No cervical adenopathy.  ?Skin: ?   General: Skin is warm and dry.  ?Neurological:  ?   Mental Status: She is alert.  ?Psychiatric:     ?   Mood and Affect: Mood normal.     ?   Behavior: Behavior normal.  ? ?  ?Assessment and Plan  ? ?Health Maintenance counseling: ?1. Anticipatory guidance: Patient counseled regarding regular dental exams q6 months, eye exams,  avoiding smoking and second hand smoke, limiting alcohol to 1 beverage per day, no illicit drugs.   ?2. Risk factor reduction:  Advised patient of need for regular exercise and diet rich with fruits and vegetables to reduce risk of heart attack and stroke. ?Wt Readings from Last 3 Encounters:  ?07/06/21 242 lb 6.4 oz (110 kg)  ?11/11/20 216 lb 1.6 oz (98 kg)  ?04/29/20 250 lb 3.2 oz (113.5 kg)  ? ?3. Immunizations/screenings/ancillary studies ?Immunization History  ?Administered Date(s) Administered  ?? Influenza, Seasonal, Injecte, Preservative Fre 04/28/2012  ?? Influenza-Unspecified 03/10/2021  ?? Moderna Covid-19 Vaccine Bivalent Booster 32yrs & up 04/11/2020, 03/10/2021  ?? PFIZER(Purple Top)SARS-COV-2 Vaccination 07/11/2019, 08/08/2019  ?? Tdap 01/06/2012  ? ?Health Maintenance Due  ?Topic Date Due  ?? Hepatitis C Screening  Never done  ?  ?4. Cervical cancer screening: 2022 ?5. Skin cancer screening- advised regular sunscreen use. Denies worrisome, changing, or new skin lesions.  ?6. Birth control/STD check: tubal ligation ?7. Smoking associated screening: non- smoker ?8. Alcohol screening: none ? ?Problem List Items Addressed This Visit   ? ?  ? Cardiovascular and Mediastinum  ? Migraine without aura and without status migrainosus, not  intractable - Primary  ?  reports having sinus headaches with change of season and allergy seasons, but sometimes they turn into migraines. Pt reports infrequently, but she has to lie down. Usually she takes Tylenol and drinks coffee for the caffeine which eases the HA. Advised she increase the Flonase nasal spray to twice a day, continue the Zyrtec daily, or switch to generic Xyzal, 1 pill daily or twice a day. Also a saline nasal spray several times a day (or Neti pot). ?  ?  ? Relevant Medications  ? acetaminophen (TYLENOL) 500 MG tablet  ? ?Other Visit Diagnoses   ? ? Encounter for general adult medical examination with abnormal  findings      ? Relevant Orders  ? CBC with Differential/Platelet (Completed)  ? Comprehensive metabolic panel (Completed)  ? Lipid panel (Completed)  ? TSH (Completed)  ? ?  ? ? ?Recommended follow up: Return for for future concerns. ?No future appointments. ? ?Lab/Order associations:fasting ? ?  ?Dulce SellarHudnell, Codi Kertz, NP ? ? ?

## 2021-07-09 ENCOUNTER — Encounter: Payer: Self-pay | Admitting: Family

## 2021-07-10 NOTE — Progress Notes (Signed)
Hi Amber Morales, ? ?Glucose (sugar) is just slightly elevated, just watch the amount of carbs including sugary sweets that your are eating. Your electrolytes, kidney and liver function, blood count, thyroid, and cholesterol numbers are all good, just the bad # (LDL) is a little elevated, monitor the amount of any fried foods, red meat, high fat dairy, etc that you may be eating. ? ?Keep up the good work with controlling your diet and continue to try and shoot for 30 minutes of exercise daily!

## 2021-07-22 ENCOUNTER — Ambulatory Visit (INDEPENDENT_AMBULATORY_CARE_PROVIDER_SITE_OTHER): Payer: 59 | Admitting: Family

## 2021-07-22 ENCOUNTER — Encounter: Payer: Self-pay | Admitting: Family

## 2021-07-22 VITALS — BP 119/72 | HR 80 | Temp 98.9°F | Ht 66.0 in | Wt 240.5 lb

## 2021-07-22 DIAGNOSIS — R635 Abnormal weight gain: Secondary | ICD-10-CM | POA: Diagnosis not present

## 2021-07-22 DIAGNOSIS — E1169 Type 2 diabetes mellitus with other specified complication: Secondary | ICD-10-CM

## 2021-07-22 DIAGNOSIS — R739 Hyperglycemia, unspecified: Secondary | ICD-10-CM

## 2021-07-22 LAB — POCT GLYCOSYLATED HEMOGLOBIN (HGB A1C): Hemoglobin A1C: 6.6 % — AB (ref 4.0–5.6)

## 2021-07-22 MED ORDER — OZEMPIC (0.25 OR 0.5 MG/DOSE) 2 MG/1.5ML ~~LOC~~ SOPN: 0.2500 mg | PEN_INJECTOR | SUBCUTANEOUS | 0 refills | Status: DC

## 2021-07-22 NOTE — Assessment & Plan Note (Signed)
Chronic - first visit to start med today, per her ins. Trulicity, Ozempic & Victoza are covered. Ran A1C today and high at 6.6, so we have more options if above meds do not work or end up being expensive. Starting Ozempic today, advised on use & SE. Wt. Loss strategies reviewed including portion control, less carbs including sweets, eating most of calories earlier in day, drinking 64oz water qd, and establishing daily exercise routine. f/u 1 mo. ?

## 2021-07-22 NOTE — Patient Instructions (Addendum)
It was very nice to see you today! ? ?I have sent the Ozempic injectable to your pharmacy, [facility]. If you need help with the injection when you receive it, just schedule a nurse visit here at the office. ?Start with 0.25mg  dose for first 2 weeks, then increase to 0.5mg . ?Continue drinking water and eating low carb (no sugar!) diet, and exercise up to 20 minutes daily if possible! ? ?Schedule a 1 month follow up visit so we can adjust the dose. ? ? ?PLEASE NOTE: ? ?If you had any lab tests please let us know if you have not heard back within a few days. You may see your results on MyChart before we have a chance to review them but we will give you a call once they are reviewed by [place]. If we ordered any referrals today, please let us know if you have not heard from their office within the next week.  ? ?Please try these tips to maintain a healthy lifestyle: ? ?Eat most of your calories during the day when you are active. Eliminate processed foods including packaged sweets (pies, cakes, cookies), reduce intake of potatoes, white bread, white pasta, and white rice. Look for whole grain options, oat flour or almond flour. ? ?Each meal should contain half fruits/vegetables, one quarter protein, and one quarter carbs (no bigger than a computer mouse). ? ?Cut down on sweet beverages. This includes juice, soda, and sweet tea. Also watch fruit intake, though this is a healthier sweet option, it still contains natural sugar! Limit to 3 servings daily. ? ?Drink at least 1 glass of water with each meal and aim for at least 8 glasses per day ? ?Exercise at least 150 minutes every week.  ? ?

## 2021-07-22 NOTE — Progress Notes (Signed)
? ?Subjective:  ? ? ? Patient ID: Amber Morales, female    DOB: 12-01-1985, 36 y.o.   MRN: [id number] ? ?Chief Complaint  ?Patient presents with  ? Weight Loss  ?  Pt states it is going well.  ? ?HPI: ?T2DM: Pt is currently maintained on the following medications for diabetes:None- new per A1C checked today. ?Denies polyuria/polydipsia/polyphagia ?Denies hypoglycemia ?Home glucose readings range: not checking.  ?Last A1C was  ?Lab Results  ?Component Value Date  ? HGBA1C 6.6 (A) 07/22/2021  ?Obesity: Patient complains of obesity. Patient cites health, increased physical ability, self-image as reasons for wanting to lose weight. Amount of time at present weight: years. Circumstances associated with regain of weight: busy life. Successful weight loss techniques attempted: self-directed dieting. Unsuccessful weight loss techniques attempted: very low calorie diet. Exercise: walking. Number of regular meals per day: 2. Number of snacking episodes per day: 3. Binge behavior?: sometimes. Eating precipitated by stress? sometimes. Use of alcohol: average 0 drinks/week. Use of medications that may cause weight gain none.  ? ? ?Health Maintenance Due  ?Topic Date Due  ? FOOT EXAM  Never done  ? OPHTHALMOLOGY EXAM  Never done  ? Hepatitis C Screening  Never done  ? ? ?Past Medical History:  ?Diagnosis Date  ? Abnormal Pap smear   ? LAST PAPP 04/2011  ? Allergic rhinitis   ? Anemia   ? CHRONIC  ? Breast mass 07/26/2016  ? rt lump near lt axilla  ? Diabetes mellitus without complication (HCC)   ? Fracture of arm CHILDHOOD  ? Fracture of wrist CHILDHOOD  ? Gestational diabetes   ? glyburide  ? Headache(784.0) X 10 YEARS  ? MIGRAINES; OTC MED  ? Hx of PTL (preterm labor), current pregnancy 04/29/2012  ? 2nd pg PTL at 32 week del at 38 weeks  ? Infection   ? UTI X 1  ? PONV (postoperative nausea and vomiting)   ? Pregnant state, incidental 04/28/2012  ? Preterm labor 2008  ? Vaginal delivery 10/27/2012  ? ? ?Past Surgical History:   ?Procedure Laterality Date  ? ABLATION    ? DILATION AND CURETTAGE OF UTERUS  04/26/2009  ? LAPAROSCOPIC TUBAL LIGATION Bilateral 12/01/2012  ? Procedure: LAPAROSCOPIC TUBAL LIGATION;  Surgeon: Kirkland Hun, MD;  Location: [facility];  Service: Gynecology;  Laterality: Bilateral;  ? ? ?Outpatient Medications Prior to Visit  ?Medication Sig Dispense Refill  ? acetaminophen (TYLENOL) 500 MG tablet Take 1,000 mg by mouth as needed for headache.    ? Cetirizine HCl (ZYRTEC ALLERGY) 10 MG CAPS 10 mg daily.    ? ?No facility-administered medications prior to visit.  ? ? ?Allergies  ?Allergen Reactions  ? Ibuprofen Hives  ? ?   ?Objective:  ?  ?Physical Exam ?Vitals and nursing note reviewed.  ?Constitutional:   ?   Appearance: Normal appearance. She is obese.  ?Cardiovascular:  ?   Rate and Rhythm: Normal rate and regular rhythm.  ?Pulmonary:  ?   Effort: Pulmonary effort is normal.  ?   Breath sounds: Normal breath sounds.  ?Musculoskeletal:     ?   General: Normal range of motion.  ?Skin: ?   General: Skin is warm and dry.  ?Neurological:  ?   Mental Status: She is alert.  ?Psychiatric:     ?   Mood and Affect: Mood normal.     ?   Behavior: Behavior normal.  ? ? ?BP 119/72 (BP Location: Left Arm, Patient Position:  Sitting, Cuff Size: Large)   Pulse 80   Temp 98.9 ?F (37.2 ?C) (Temporal)   Ht 5\' 6"  (1.676 m)   Wt 240 lb 8 oz (109.1 kg)   SpO2 98%   BMI 38.82 kg/m?  ?Wt Readings from Last 3 Encounters:  ?07/22/21 240 lb 8 oz (109.1 kg)  ?07/06/21 242 lb 6.4 oz (110 kg)  ?11/11/20 216 lb 1.6 oz (98 kg)  ? ?   ?Assessment & Plan:  ? ?Problem List Items Addressed This Visit   ? ?  ? Endocrine  ? Type 2 diabetes mellitus with morbid obesity (HCC)  ?  NEW - pt fasting BS last time only 109, today A1C is 6.6. pt here for weight loss and her insurance covers Ozempic, trulicity & Victoza under that dx. Will start Ozempic, and run under both diagnoses, if not losing weight with this will switch to [facility]. advised pt on  low carb diet and daily exercise. f/u 1 mo. ?  ?  ? Relevant Medications  ? Semaglutide,0.25 or 0.5MG /DOS, (OZEMPIC, 0.25 OR 0.5 MG/DOSE,) 2 MG/1.5ML SOPN  ?  ? Other  ? Abnormal weight gain - Primary  ?  Chronic - first visit to start med today, per her ins. Trulicity, Ozempic & Victoza are covered. Ran A1C today and high at 6.6, so we have more options if above meds do not work or end up being expensive. Starting Ozempic today, advised on use & SE. Wt. Loss strategies reviewed including portion control, less carbs including sweets, eating most of calories earlier in day, drinking 64oz water qd, and establishing daily exercise routine. f/u 1 mo. ?  ?  ? Relevant Medications  ? Semaglutide,0.25 or 0.5MG /DOS, (OZEMPIC, 0.25 OR 0.5 MG/DOSE,) 2 MG/1.5ML SOPN  ? ?Other Visit Diagnoses   ? ? Hyperglycemia      ? Relevant Orders  ? POCT HgB A1C (Completed)  ? ?  ? ? ?Meds ordered this encounter  ?Medications  ? Semaglutide,0.25 or 0.5MG /DOS, (OZEMPIC, 0.25 OR 0.5 MG/DOSE,) 2 MG/1.5ML SOPN  ?  Sig: Inject 0.25 mg into the skin once a week.  ?  Dispense:  1.5 mL  ?  Refill:  0  ?  Order Specific Question:   Supervising Provider  ?  Answer:   ANDY, CAMILLE L [[id number]]  ? ? ?[facility], NP ? ?

## 2021-07-22 NOTE — Assessment & Plan Note (Addendum)
NEW - pt fasting BS last time only 109, today A1C is 6.6. pt here for weight loss and her insurance covers Ozempic, trulicity & Victoza under that dx. Will start Ozempic, and run under both diagnoses, if not losing weight with this will switch to [facility]. advised pt on low carb diet and daily exercise. f/u 1 mo. ?

## 2021-08-27 ENCOUNTER — Encounter: Payer: Self-pay | Admitting: Family

## 2021-08-27 ENCOUNTER — Ambulatory Visit (INDEPENDENT_AMBULATORY_CARE_PROVIDER_SITE_OTHER): Payer: 59 | Admitting: Family

## 2021-08-27 DIAGNOSIS — E1169 Type 2 diabetes mellitus with other specified complication: Secondary | ICD-10-CM

## 2021-08-27 MED ORDER — SEMAGLUTIDE (1 MG/DOSE) 4 MG/3ML ~~LOC~~ SOPN: 1.0000 mg | PEN_INJECTOR | SUBCUTANEOUS | 0 refills | Status: DC

## 2021-08-27 NOTE — Assessment & Plan Note (Signed)
pt started Ozempic,0.25mg x2w, then 0.5mg x2w, pt denies any side effects, has noticed decreased appetite. Wt down 2lbs, will increase dose to 1mg . Continue to advise on wt loss strategies. ?

## 2021-08-27 NOTE — Progress Notes (Signed)
? ?Subjective:  ? ? ? Patient ID: Amber Morales, female    DOB: 28-Oct-1985, 36 y.o.   MRN: [id number] ? ?Chief Complaint  ?Patient presents with  ? Weight Loss  ? ? ?HPI: ?Obesity: Patient complains of obesity. Patient cites health, increased physical ability, self-image as reasons for wanting to lose weight. Amount of time at present weight: years. Circumstances associated with regain of weight: busy life. Successful weight loss techniques attempted: self-directed dieting. Unsuccessful weight loss techniques attempted: very low calorie diet. Exercise: walking. Number of regular meals per day: 2. Number of snacking episodes per day: 3. Binge behavior?: sometimes. Eating precipitated by stress? sometimes. Use of alcohol: average 0 drinks/week. Use of medications that may cause weight gain none. Pt started Ozempic, 0.25mg x2w, then 0.5mg x2w, pt denies any side effects. ? ?Assessment & Plan:  ? ?Problem List Items Addressed This Visit   ? ?  ? Endocrine  ? Type 2 diabetes mellitus with morbid obesity (HCC) - Primary  ?  pt started Ozempic,0.25mg x2w, then 0.5mg x2w, pt denies any side effects, has noticed decreased appetite. Wt down 2lbs, will increase dose to 1mg . Continue to advise on wt loss strategies. ? ?  ?  ? Relevant Medications  ? Semaglutide, 1 MG/DOSE, 4 MG/3ML SOPN  ? ? ?Outpatient Medications Prior to Visit  ?Medication Sig Dispense Refill  ? acetaminophen (TYLENOL) 500 MG tablet Take 1,000 mg by mouth as needed for headache.    ? Cetirizine HCl (ZYRTEC ALLERGY) 10 MG CAPS 10 mg daily.    ? Semaglutide,0.25 or 0.5MG /DOS, (OZEMPIC, 0.25 OR 0.5 MG/DOSE,) 2 MG/1.5ML SOPN Inject 0.25 mg into the skin once a week. 1.5 mL 0  ? ?No facility-administered medications prior to visit.  ? ? ?Past Medical History:  ?Diagnosis Date  ? Abnormal Pap smear   ? LAST PAPP 04/2011  ? Allergic rhinitis   ? Anemia   ? CHRONIC  ? Breast mass 07/26/2016  ? rt lump near lt axilla  ? Diabetes mellitus without complication (HCC)   ?  Fracture of arm CHILDHOOD  ? Fracture of wrist CHILDHOOD  ? Gestational diabetes   ? glyburide  ? Hx of PTL (preterm labor), current pregnancy 04/29/2012  ? 2nd pg PTL at 32 week del at 38 weeks  ? PONV (postoperative nausea and vomiting)   ? Pregnant state, incidental 04/28/2012  ? Preterm labor 2008  ? Vaginal delivery 10/27/2012  ? ? ?Past Surgical History:  ?Procedure Laterality Date  ? ABLATION    ? DILATION AND CURETTAGE OF UTERUS  04/26/2009  ? LAPAROSCOPIC TUBAL LIGATION Bilateral 12/01/2012  ? Procedure: LAPAROSCOPIC TUBAL LIGATION;  Surgeon: 01/31/2013, MD;  Location: [facility];  Service: Gynecology;  Laterality: Bilateral;  ? ? ?Allergies  ?Allergen Reactions  ? Ibuprofen Hives  ? ? ?   ?Objective:  ?  ?Physical Exam ?Vitals and nursing note reviewed.  ?Constitutional:   ?   Appearance: Normal appearance. She is obese.  ?Cardiovascular:  ?   Rate and Rhythm: Normal rate and regular rhythm.  ?Pulmonary:  ?   Effort: Pulmonary effort is normal.  ?   Breath sounds: Normal breath sounds.  ?Musculoskeletal:     ?   General: Normal range of motion.  ?Skin: ?   General: Skin is warm and dry.  ?Neurological:  ?   Mental Status: She is alert.  ?Psychiatric:     ?   Mood and Affect: Mood normal.     ?   Behavior: Behavior  normal.  ? ? ?BP 110/74 (BP Location: Left Arm, Patient Position: Sitting, Cuff Size: Large)   Pulse 74   Temp 98.2 ?F (36.8 ?C) (Temporal)   Ht 5\' 6"  (1.676 m)   Wt 239 lb 8 oz (108.6 kg)   SpO2 100%   BMI 38.66 kg/m?  ?Wt Readings from Last 3 Encounters:  ?08/27/21 239 lb 8 oz (108.6 kg)  ?07/22/21 240 lb 8 oz (109.1 kg)  ?07/06/21 242 lb 6.4 oz (110 kg)  ? ?   ? ?07/08/21, NP ? ?

## 2021-09-23 ENCOUNTER — Ambulatory Visit (INDEPENDENT_AMBULATORY_CARE_PROVIDER_SITE_OTHER): Payer: 59 | Admitting: Family

## 2021-09-23 ENCOUNTER — Encounter: Payer: Self-pay | Admitting: Family

## 2021-09-23 DIAGNOSIS — E1169 Type 2 diabetes mellitus with other specified complication: Secondary | ICD-10-CM

## 2021-09-23 MED ORDER — SEMAGLUTIDE (1 MG/DOSE) 4 MG/3ML ~~LOC~~ SOPN: 1.0000 mg | PEN_INJECTOR | SUBCUTANEOUS | 1 refills | Status: DC

## 2021-09-23 NOTE — Progress Notes (Signed)
Subjective:     Patient ID: Amber Morales, female    DOB: 1985-12-30, 36 y.o.   MRN: [id number]  Chief Complaint  Patient presents with   Diabetes    HPI: T2DM: Pt is currently maintained on the following medications for diabetes:Ozempic Failed meds include: none Denies polyuria/polydipsia/polyphagia Denies hypoglycemia Home glucose readings range: not checking. Last A1C was  Lab Results  Component Value Date   HGBA1C 6.6 (A) 07/22/2021    Assessment & Plan:   Problem List Items Addressed This Visit       Endocrine   Type 2 diabetes mellitus with morbid obesity (HCC) - Primary    wt down another 4lbs, continue Ozempic 1mg , if wt starts to plateau, will look at switching to [facility]. f/u 2 mos.       Relevant Medications   Semaglutide, 1 MG/DOSE, 4 MG/3ML SOPN    Outpatient Medications Prior to Visit  Medication Sig Dispense Refill   acetaminophen (TYLENOL) 500 MG tablet Take 1,000 mg by mouth as needed for headache.     Cetirizine HCl (ZYRTEC ALLERGY) 10 MG CAPS 10 mg daily.     Semaglutide, 1 MG/DOSE, 4 MG/3ML SOPN Inject 1 mg as directed once a week. 3 mL 0   No facility-administered medications prior to visit.    Past Medical History:  Diagnosis Date   Abnormal Pap smear    LAST PAPP 04/2011   Allergic rhinitis    Anemia    CHRONIC   Breast mass 07/26/2016   rt lump near lt axilla   Diabetes mellitus without complication (HCC)    Fracture of arm CHILDHOOD   Fracture of wrist CHILDHOOD   Gestational diabetes    glyburide   Hx of PTL (preterm labor), current pregnancy 04/29/2012   2nd pg PTL at 32 week del at 38 weeks   PONV (postoperative nausea and vomiting)    Pregnant state, incidental 04/28/2012   Preterm labor 2008   Vaginal delivery 10/27/2012    Past Surgical History:  Procedure Laterality Date   ABLATION     DILATION AND CURETTAGE OF UTERUS  04/26/2009   LAPAROSCOPIC TUBAL LIGATION Bilateral 12/01/2012   Procedure: LAPAROSCOPIC TUBAL  LIGATION;  Surgeon: 01/31/2013, MD;  Location: [facility];  Service: Gynecology;  Laterality: Bilateral;    Allergies  Allergen Reactions   Ibuprofen Hives       Objective:    Physical Exam Vitals and nursing note reviewed.  Constitutional:      Appearance: Normal appearance. She is obese.  Cardiovascular:     Rate and Rhythm: Normal rate and regular rhythm.  Pulmonary:     Effort: Pulmonary effort is normal.     Breath sounds: Normal breath sounds.  Musculoskeletal:        General: Normal range of motion.  Skin:    General: Skin is warm and dry.  Neurological:     Mental Status: She is alert.  Psychiatric:        Mood and Affect: Mood normal.        Behavior: Behavior normal.    BP 102/74 (BP Location: Left Arm, Patient Position: Sitting, Cuff Size: Large)   Pulse 86   Temp 97.9 F (36.6 C) (Temporal)   Ht 5\' 6"  (1.676 m)   Wt 235 lb (106.6 kg)   SpO2 98%   BMI 37.93 kg/m  Wt Readings from Last 3 Encounters:  09/23/21 235 lb (106.6 kg)  08/27/21 239 lb 8 oz (108.6 kg)  07/22/21 240 lb 8 oz (109.1 kg)     [name], NP

## 2021-09-23 NOTE — Assessment & Plan Note (Addendum)
wt down another 4lbs, continue Ozempic 1mg , if wt starts to plateau, will look at switching to [facility]. f/u 2 mos.

## 2021-09-29 ENCOUNTER — Ambulatory Visit: Payer: 59 | Admitting: Family

## 2021-10-17 ENCOUNTER — Encounter: Payer: Self-pay | Admitting: Family

## 2021-10-17 DIAGNOSIS — E1169 Type 2 diabetes mellitus with other specified complication: Secondary | ICD-10-CM

## 2021-10-20 MED ORDER — TIRZEPATIDE 5 MG/0.5ML ~~LOC~~ SOAJ: 5.0000 mg | SUBCUTANEOUS | 0 refills | Status: DC

## 2021-10-21 NOTE — Telephone Encounter (Signed)
I Received a fax stating that pt does not need a PA for her Mounjaro. I called pt and Lvm to see if she was able to pick it up from the pharmacy.

## 2021-11-15 ENCOUNTER — Other Ambulatory Visit: Payer: Self-pay | Admitting: Family

## 2021-11-23 ENCOUNTER — Ambulatory Visit: Payer: 59 | Admitting: Family

## 2021-11-24 ENCOUNTER — Ambulatory Visit: Payer: 59 | Admitting: Family

## 2021-11-25 ENCOUNTER — Ambulatory Visit (INDEPENDENT_AMBULATORY_CARE_PROVIDER_SITE_OTHER): Payer: 59 | Admitting: Family

## 2021-11-25 DIAGNOSIS — E1169 Type 2 diabetes mellitus with other specified complication: Secondary | ICD-10-CM

## 2021-11-25 MED ORDER — TIRZEPATIDE 7.5 MG/0.5ML ~~LOC~~ SOAJ: 7.5000 mg | SUBCUTANEOUS | 0 refills | Status: DC

## 2021-11-25 NOTE — Assessment & Plan Note (Signed)
   chronic  Mounjaro 5mg  weekly, wt down 4lbs  will increase to 7.5mg   f/u in 5mo

## 2021-11-25 NOTE — Progress Notes (Signed)
Patient ID: Amber Morales, female    DOB: [birth year], 36 y.o.   MRN: [id number]  Chief Complaint  Patient presents with   Diabetes   Weight Loss    HPI: T2DM: Pt is currently maintained on the following medications for diabetes: Mounjaro Failed meds include: none Denies polyuria/polydipsia/polyphagia Denies hypoglycemia Home glucose readings range: not checking. Last A1C was  Lab Results  Component Value Date   HGBA1C 6.6 (A) 07/22/2021    Assessment & Plan:   Problem List Items Addressed This Visit       Endocrine   Type 2 diabetes mellitus with morbid obesity (HCC) - Primary    chronic Mounjaro 5mg  weekly, wt down 4lbs will increase to 7.5mg  f/u in 55mo      Relevant Medications   tirzepatide (MOUNJARO) 7.5 MG/0.5ML Pen    Subjective:    Outpatient Medications Prior to Visit  Medication Sig Dispense Refill   acetaminophen (TYLENOL) 500 MG tablet Take 1,000 mg by mouth as needed for headache.     Cetirizine HCl (ZYRTEC ALLERGY) 10 MG CAPS 10 mg daily.     MOUNJARO 5 MG/0.5ML Pen INJECT 5 MG UNDER THE SKIN ONCE WEEKLY DISCONTINUE USE OF OZEMPIC 2 mL 0   No facility-administered medications prior to visit.   Past Medical History:  Diagnosis Date   Abnormal Pap smear    LAST PAPP 04/2011   Allergic rhinitis    Anemia    CHRONIC   Breast mass 07/26/2016   rt lump near lt axilla   Diabetes mellitus without complication (HCC)    Fracture of arm CHILDHOOD   Fracture of wrist CHILDHOOD   Gestational diabetes    glyburide   Hx of PTL (preterm labor), current pregnancy 04/29/2012   2nd pg PTL at 32 week del at 38 weeks   PONV (postoperative nausea and vomiting)    Pregnant state, incidental 04/28/2012   Preterm labor 2008   Vaginal delivery 10/27/2012   Past Surgical History:  Procedure Laterality Date   ABLATION     DILATION AND CURETTAGE OF UTERUS  04/26/2009   LAPAROSCOPIC TUBAL LIGATION Bilateral 12/01/2012   Procedure: LAPAROSCOPIC TUBAL LIGATION;   Surgeon: 01/31/2013, MD;  Location: [facility];  Service: Gynecology;  Laterality: Bilateral;   Allergies  Allergen Reactions   Ibuprofen Hives      Objective:    Physical Exam Vitals and nursing note reviewed.  Constitutional:      Appearance: Normal appearance. She is obese.  Cardiovascular:     Rate and Rhythm: Normal rate and regular rhythm.  Pulmonary:     Effort: Pulmonary effort is normal.     Breath sounds: Normal breath sounds.  Musculoskeletal:        General: Normal range of motion.  Skin:    General: Skin is warm and dry.  Neurological:     Mental Status: She is alert.  Psychiatric:        Mood and Affect: Mood normal.        Behavior: Behavior normal.    BP 119/76 (BP Location: Left Arm, Patient Position: Sitting, Cuff Size: Large)   Pulse 71   Temp 98 F (36.7 C) (Temporal)   Ht 5\' 6"  (1.676 m)   Wt 227 lb 2 oz (103 kg)   LMP  (LMP Unknown)   SpO2 100%   BMI 36.66 kg/m  Wt Readings from Last 3 Encounters:  11/25/21 227 lb 2 oz (103 kg)  09/23/21 235 lb (106.6  kg)  08/27/21 239 lb 8 oz (108.6 kg)       [name], NP

## 2021-12-10 ENCOUNTER — Other Ambulatory Visit: Payer: Self-pay | Admitting: Family

## 2021-12-29 ENCOUNTER — Ambulatory Visit: Payer: 59 | Admitting: Family

## 2022-01-05 ENCOUNTER — Ambulatory Visit: Payer: 59 | Admitting: Family

## 2022-01-11 ENCOUNTER — Encounter: Payer: Self-pay | Admitting: Family

## 2022-01-11 ENCOUNTER — Ambulatory Visit (INDEPENDENT_AMBULATORY_CARE_PROVIDER_SITE_OTHER): Payer: 59 | Admitting: Family

## 2022-01-11 DIAGNOSIS — E1169 Type 2 diabetes mellitus with other specified complication: Secondary | ICD-10-CM

## 2022-01-11 DIAGNOSIS — N898 Other specified noninflammatory disorders of vagina: Secondary | ICD-10-CM | POA: Insufficient documentation

## 2022-01-11 NOTE — Progress Notes (Signed)
Patient ID: Amber Morales, female    DOB: 02/26/1986, 36 y.o.   MRN: [id number]  Chief Complaint  Patient presents with   Diabetes    Follow up   HPI: T2DM: Pt is currently maintained on the following medications for diabetes: Mounjaro Failed meds include: none Denies polyuria/polydipsia/polyphagia Denies hypoglycemia Home glucose readings range: not checking.   Assessment & Plan:   Problem List Items Addressed This Visit       Endocrine   Type 2 diabetes mellitus with morbid obesity ([place]) - Primary    chronic Mounjaro 7.5mg  weekly, wt down 5lbs will continue same dose, advised can let me know if weight plateaus and I will increase dose prior to next visit.  f/u in 58mo      Relevant Orders   HM DIABETES FOOT EXAM (Completed)    Subjective:    Outpatient Medications Prior to Visit  Medication Sig Dispense Refill   acetaminophen (TYLENOL) 500 MG tablet Take 1,000 mg by mouth as needed for headache.     Cetirizine HCl (ZYRTEC ALLERGY) 10 MG CAPS 10 mg daily.     tirzepatide (MOUNJARO) 7.5 MG/0.5ML Pen Inject 7.5 mg into the skin once a week. 6 mL 0   MOUNJARO 5 MG/0.5ML Pen INJECT 5 MG UNDER THE SKIN ONCE WEEKLY DISCONTINUE USE OF OZEMPIC 2 mL 0   No facility-administered medications prior to visit.   Past Medical History:  Diagnosis Date   Abnormal Pap smear    LAST PAPP 04/2011   Allergic rhinitis    Anemia    CHRONIC   Breast mass 07/26/2016   rt lump near lt axilla   Diabetes mellitus without complication ([place])    Fracture of arm CHILDHOOD   Fracture of wrist CHILDHOOD   Gestational diabetes    glyburide   Hx of PTL (preterm labor), current pregnancy 04/29/2012   2nd pg PTL at 32 week del at 38 weeks   PONV (postoperative nausea and vomiting)    Pregnant state, incidental 04/28/2012   Preterm labor 2008   Vaginal delivery 10/27/2012   Past Surgical History:  Procedure Laterality Date   ABLATION     DILATION AND CURETTAGE OF UTERUS  04/26/2009    LAPAROSCOPIC TUBAL LIGATION Bilateral 12/01/2012   Procedure: LAPAROSCOPIC TUBAL LIGATION;  Surgeon: Ena Dawley, MD;  Location: [place] [facility];  Service: Gynecology;  Laterality: Bilateral;   Allergies  Allergen Reactions   Ibuprofen Hives      Objective:    Physical Exam Vitals and nursing note reviewed.  Constitutional:      Appearance: Normal appearance. She is obese.  Cardiovascular:     Rate and Rhythm: Normal rate and regular rhythm.  Pulmonary:     Effort: Pulmonary effort is normal.     Breath sounds: Normal breath sounds.  Musculoskeletal:        General: Normal range of motion.  Skin:    General: Skin is warm and dry.  Neurological:     Mental Status: She is alert.  Psychiatric:        Mood and Affect: Mood normal.        Behavior: Behavior normal.    BP 102/74 (BP Location: Left Arm, Patient Position: Sitting, Cuff Size: Large)   Pulse 81   Temp 98.1 F (36.7 C) (Temporal)   Ht 5\' 6"  (1.676 m)   Wt 222 lb 3.2 oz (100.8 kg)   LMP  (LMP Unknown)   SpO2 94%   BMI 35.86 kg/m  Wt Readings from Last 3 Encounters:  01/11/22 222 lb 3.2 oz (100.8 kg)  11/25/21 227 lb 2 oz (103 kg)  09/23/21 235 lb (106.6 kg)       [name], NP

## 2022-01-11 NOTE — Assessment & Plan Note (Addendum)
.   chronic . Mounjaro 7.5mg  weekly, wt down 5lbs . will continue same dose, advised can let me know if weight plateaus and I will increase dose prior to next visit.  . f/u in 65mo

## 2022-01-18 ENCOUNTER — Encounter: Payer: Self-pay | Admitting: *Deleted

## 2022-03-03 ENCOUNTER — Other Ambulatory Visit: Payer: Self-pay | Admitting: Family

## 2022-03-03 DIAGNOSIS — E1169 Type 2 diabetes mellitus with other specified complication: Secondary | ICD-10-CM

## 2022-03-12 ENCOUNTER — Encounter: Payer: Self-pay | Admitting: Family

## 2022-03-12 ENCOUNTER — Other Ambulatory Visit: Payer: Self-pay

## 2022-03-12 DIAGNOSIS — E1169 Type 2 diabetes mellitus with other specified complication: Secondary | ICD-10-CM

## 2022-03-12 MED ORDER — MOUNJARO 7.5 MG/0.5ML ~~LOC~~ SOAJ: SUBCUTANEOUS | 1 refills | Status: DC

## 2022-04-12 ENCOUNTER — Ambulatory Visit: Payer: 59 | Admitting: Family

## 2022-04-22 ENCOUNTER — Ambulatory Visit: Payer: 59 | Admitting: Family

## 2022-05-17 ENCOUNTER — Ambulatory Visit (INDEPENDENT_AMBULATORY_CARE_PROVIDER_SITE_OTHER): Payer: 59 | Admitting: Family

## 2022-05-17 ENCOUNTER — Encounter: Payer: Self-pay | Admitting: Family

## 2022-05-17 DIAGNOSIS — E1169 Type 2 diabetes mellitus with other specified complication: Secondary | ICD-10-CM | POA: Diagnosis not present

## 2022-05-17 MED ORDER — TIRZEPATIDE 12.5 MG/0.5ML ~~LOC~~ SOAJ: 12.5000 mg | SUBCUTANEOUS | 0 refills | Status: DC

## 2022-05-17 NOTE — Assessment & Plan Note (Addendum)
chronic Mounjaro 7.5mg  weekly, wt down 11 more lbs states she has reached a plateau on her scale at home over last month will increase dose to 12.5mg , continue to advise on possible SE advised on NOOM online to help with long term wt loss once med is no longer covered f/u in 19mo

## 2022-05-17 NOTE — Progress Notes (Signed)
Patient ID: Amber Morales, female    DOB: 1985-06-28, 37 y.o.   MRN: [id number]  Chief Complaint  Patient presents with   Diabetes    Follow up    HPI: T2DM: Pt is currently maintained on the following medications for diabetes: Mounjaro 7.5mg  Failed meds include: none Denies polyuria/polydipsia/polyphagia Denies hypoglycemia Home glucose readings range: not checking.  Assessment & Plan:   Problem List Items Addressed This Visit       Endocrine   Type 2 diabetes mellitus with morbid obesity ([place]) - Primary    chronic Mounjaro 7.5mg  weekly, wt down 11 more lbs states she has reached a plateau on her scale at home over last month will increase dose to 12.5mg , continue to advise on possible SE advised on NOOM online to help with long term wt loss once med is no longer covered f/u in 23mo       Relevant Medications   tirzepatide (MOUNJARO) 12.5 MG/0.5ML Pen   Subjective:    Outpatient Medications Prior to Visit  Medication Sig Dispense Refill   acetaminophen (TYLENOL) 500 MG tablet Take 1,000 mg by mouth as needed for headache.     Cetirizine HCl (ZYRTEC ALLERGY) 10 MG CAPS 10 mg daily.     tirzepatide (MOUNJARO) 7.5 MG/0.5ML Pen INJECT 7.5 MG UNDER THE SKIN ONCE WEEKLY 18 mL 1   No facility-administered medications prior to visit.   Past Medical History:  Diagnosis Date   Abnormal Pap smear    LAST PAPP 04/2011   Allergic rhinitis    Anemia    CHRONIC   Breast mass 07/26/2016   rt lump near lt axilla   Diabetes mellitus without complication ([place])    Fracture of arm CHILDHOOD   Fracture of wrist CHILDHOOD   Gestational diabetes    glyburide   Hx of PTL (preterm labor), current pregnancy 04/29/2012   2nd pg PTL at 32 week del at 38 weeks   PONV (postoperative nausea and vomiting)    Pregnant state, incidental 04/28/2012   Preterm labor 2008   Vaginal delivery 10/27/2012   Past Surgical History:  Procedure Laterality Date   ABLATION     DILATION AND CURETTAGE OF  UTERUS  04/26/2009   LAPAROSCOPIC TUBAL LIGATION Bilateral 12/01/2012   Procedure: LAPAROSCOPIC TUBAL LIGATION;  Surgeon: Ena Dawley, MD;  Location: [facility];  Service: Gynecology;  Laterality: Bilateral;   Allergies  Allergen Reactions   Ibuprofen Hives      Objective:    Physical Exam Vitals and nursing note reviewed.  Constitutional:      Appearance: Normal appearance. She is obese.  Cardiovascular:     Rate and Rhythm: Normal rate and regular rhythm.  Pulmonary:     Effort: Pulmonary effort is normal.     Breath sounds: Normal breath sounds.  Musculoskeletal:        General: Normal range of motion.  Skin:    General: Skin is warm and dry.  Neurological:     Mental Status: She is alert.  Psychiatric:        Mood and Affect: Mood normal.        Behavior: Behavior normal.    BP 111/74 (BP Location: Left Arm, Patient Position: Sitting, Cuff Size: Large)   Pulse 76   Temp (!) 97.4 F (36.3 C) (Temporal)   Ht 5\' 6"  (1.676 m)   Wt 211 lb 2 oz (95.8 kg)   SpO2 100%   BMI 34.08 kg/m  Wt Readings from  Last 3 Encounters:  05/17/22 211 lb 2 oz (95.8 kg)  01/11/22 222 lb 3.2 oz (100.8 kg)  11/25/21 227 lb 2 oz (103 kg)       Jeanie Sewer, NP

## 2022-08-17 ENCOUNTER — Ambulatory Visit: Payer: 59 | Admitting: Family

## 2022-08-25 ENCOUNTER — Ambulatory Visit (INDEPENDENT_AMBULATORY_CARE_PROVIDER_SITE_OTHER): Payer: 59 | Admitting: Family

## 2022-08-25 ENCOUNTER — Encounter: Payer: Self-pay | Admitting: Family

## 2022-08-25 DIAGNOSIS — R7989 Other specified abnormal findings of blood chemistry: Secondary | ICD-10-CM

## 2022-08-25 DIAGNOSIS — E1169 Type 2 diabetes mellitus with other specified complication: Secondary | ICD-10-CM | POA: Diagnosis not present

## 2022-08-25 DIAGNOSIS — Z7985 Long-term (current) use of injectable non-insulin antidiabetic drugs: Secondary | ICD-10-CM

## 2022-08-25 LAB — COMPREHENSIVE METABOLIC PANEL
ALT: 10 U/L (ref 0–35)
AST: 14 U/L (ref 0–37)
Albumin: 4 g/dL (ref 3.5–5.2)
Alkaline Phosphatase: 30 U/L — ABNORMAL LOW (ref 39–117)
BUN: 12 mg/dL (ref 6–23)
CO2: 25 mEq/L (ref 19–32)
Calcium: 9.1 mg/dL (ref 8.4–10.5)
Chloride: 106 mEq/L (ref 96–112)
Creatinine, Ser: 0.67 mg/dL (ref 0.40–1.20)
GFR: 110.37 mL/min (ref >60.00)
Glucose, Bld: 68 mg/dL — ABNORMAL LOW (ref 70–99)
Potassium: 4.1 mEq/L (ref 3.5–5.1)
Sodium: 138 mEq/L (ref 135–145)
Total Bilirubin: 0.7 mg/dL (ref 0.2–1.2)
Total Protein: 6.8 g/dL (ref 6.0–8.3)

## 2022-08-25 LAB — LIPID PANEL
Cholesterol: 189 mg/dL (ref 0–200)
HDL: 51.7 mg/dL (ref >39.00)
LDL Cholesterol: 127 mg/dL — ABNORMAL HIGH (ref 0–99)
NonHDL: 137.38
Total CHOL/HDL Ratio: 4
Triglycerides: 54 mg/dL (ref 0.0–149.0)
VLDL: 10.8 mg/dL (ref 0.0–40.0)

## 2022-08-25 MED ORDER — TIRZEPATIDE 12.5 MG/0.5ML ~~LOC~~ SOAJ: 12.5000 mg | SUBCUTANEOUS | 0 refills | Status: DC

## 2022-08-25 NOTE — Progress Notes (Signed)
Patient ID: Amber Morales, female    DOB: October 26, 1985, 37 y.o.   MRN: [id number]  Chief Complaint  Patient presents with   Diabetes   HPI: T2DM: Pt is currently maintained on the following medications for diabetes: Mounjaro 12.5mg  Failed meds include: none Denies polyuria/polydipsia/polyphagia Denies hypoglycemia Home glucose readings range: not checking.  Assessment & Plan:   Problem List Items Addressed This Visit     Type 2 diabetes mellitus with morbid obesity (HCC) - Primary    chronic Mounjaro 12.5mg  weekly, wt down 7 more lbs states she had a few SE w/this last increase, took a few weeks to get adjusted, wants to remain on same dose sending 12.5mg  refill checking labs today f/u in 41mo       Relevant Medications   tirzepatide (MOUNJARO) 12.5 MG/0.5ML Pen   Other Relevant Orders   Comp Met (CMET)   Other Visit Diagnoses     High serum low-density lipoprotein (LDL)       Relevant Orders   Lipid panel      Subjective:    Outpatient Medications Prior to Visit  Medication Sig Dispense Refill   acetaminophen (TYLENOL) 500 MG tablet Take 1,000 mg by mouth as needed for headache.     Cetirizine HCl (ZYRTEC ALLERGY) 10 MG CAPS 10 mg daily.     tirzepatide (MOUNJARO) 12.5 MG/0.5ML Pen Inject 12.5 mg into the skin once a week. 6 mL 0   No facility-administered medications prior to visit.   Past Medical History:  Diagnosis Date   Abnormal Pap smear    LAST PAPP 04/2011   Allergic rhinitis    Anemia    CHRONIC   Breast mass 07/26/2016   rt lump near lt axilla   Diabetes mellitus without complication (HCC)    Fracture of arm CHILDHOOD   Fracture of wrist CHILDHOOD   Gestational diabetes    glyburide   Hx of PTL (preterm labor), current pregnancy 04/29/2012   2nd pg PTL at 32 week del at 38 weeks   PONV (postoperative nausea and vomiting)    Pregnant state, incidental 04/28/2012   Preterm labor 2008   Vaginal delivery 10/27/2012   Past Surgical History:   Procedure Laterality Date   ABLATION     DILATION AND CURETTAGE OF UTERUS  04/26/2009   LAPAROSCOPIC TUBAL LIGATION Bilateral 12/01/2012   Procedure: LAPAROSCOPIC TUBAL LIGATION;  Surgeon: Kirkland Hun, MD;  Location: [facility];  Service: Gynecology;  Laterality: Bilateral;   Allergies  Allergen Reactions   Ibuprofen Hives      Objective:    Physical Exam Vitals and nursing note reviewed.  Constitutional:      Appearance: Normal appearance. She is obese.  Cardiovascular:     Rate and Rhythm: Normal rate and regular rhythm.  Pulmonary:     Effort: Pulmonary effort is normal.     Breath sounds: Normal breath sounds.  Musculoskeletal:        General: Normal range of motion.  Skin:    General: Skin is warm and dry.  Neurological:     Mental Status: She is alert.  Psychiatric:        Mood and Affect: Mood normal.        Behavior: Behavior normal.    BP 103/69 (BP Location: Left Arm, Patient Position: Sitting, Cuff Size: Large)   Pulse 78   Temp 97.8 F (36.6 C) (Temporal)   Ht 5\' 6"  (1.676 m)   Wt 204 lb (92.5 kg)  LMP 08/22/2022 (Exact Date)   SpO2 99%   BMI 32.93 kg/m  Wt Readings from Last 3 Encounters:  08/25/22 204 lb (92.5 kg)  05/17/22 211 lb 2 oz (95.8 kg)  01/11/22 222 lb 3.2 oz (100.8 kg)      [name], NP

## 2022-08-25 NOTE — Assessment & Plan Note (Signed)
chronic Mounjaro 12.5mg  weekly, wt down 7 more lbs states she had a few SE w/this last increase, took a few weeks to get adjusted, wants to remain on same dose sending 12.5mg  refill checking labs today f/u in 36mo

## 2022-11-25 ENCOUNTER — Encounter: Payer: Self-pay | Admitting: Family

## 2022-11-25 ENCOUNTER — Ambulatory Visit (INDEPENDENT_AMBULATORY_CARE_PROVIDER_SITE_OTHER): Payer: 59 | Admitting: Family

## 2022-11-25 DIAGNOSIS — Z1159 Encounter for screening for other viral diseases: Secondary | ICD-10-CM | POA: Diagnosis not present

## 2022-11-25 DIAGNOSIS — G43009 Migraine without aura, not intractable, without status migrainosus: Secondary | ICD-10-CM | POA: Diagnosis not present

## 2022-11-25 DIAGNOSIS — E1169 Type 2 diabetes mellitus with other specified complication: Secondary | ICD-10-CM | POA: Diagnosis not present

## 2022-11-25 DIAGNOSIS — E782 Mixed hyperlipidemia: Secondary | ICD-10-CM | POA: Diagnosis not present

## 2022-11-25 MED ORDER — SUMATRIPTAN SUCCINATE 50 MG PO TABS: 50.0000 mg | ORAL_TABLET | ORAL | 2 refills | Status: DC

## 2022-11-25 MED ORDER — TIRZEPATIDE 12.5 MG/0.5ML ~~LOC~~ SOAJ
12.5000 mg | SUBCUTANEOUS | 0 refills | Status: DC
Start: 2022-11-25 — End: 2023-02-23

## 2022-11-25 MED ORDER — METOPROLOL SUCCINATE ER 25 MG PO TB24
12.5000 mg | ORAL_TABLET | Freq: Every day | ORAL | 2 refills | Status: DC
Start: 2022-11-25 — End: 2023-02-21

## 2022-11-25 MED ORDER — CYCLOBENZAPRINE HCL 5 MG PO TABS
5.0000 mg | ORAL_TABLET | Freq: Three times a day (TID) | ORAL | 2 refills | Status: DC | PRN
Start: 2022-11-25 — End: 2023-06-09

## 2022-11-25 NOTE — Assessment & Plan Note (Signed)
chronic, Unstable sinus headaches w/change of season and allergy seasons- turn into migraines taking Flonase bid, Zyrtec-D, Tylenol and drinks coffee for the caffeine which eases the HA 3-4 per week starting Imitrex 50mg  w/Flexeril 5-10mg , repeat @2h  prn also sending Metoprolol 12.5mg  at bedtime  advised on use & SE of meds f/u 3 mos or prn

## 2022-11-25 NOTE — Patient Instructions (Addendum)
It was very nice to see you today!   I will review your lab results via MyChart in a few days.  I have sent a refill for Mounjaro I also have sent in generic Imitrex and Flexeril to take for your migraines, follow directions on bottles. Let me know if these are not working for you or you have bad side effects. I am also sending a low dose of Metoprolol to take at bedtime to help prevent the headaches. This may cause mild lightheadedness just for the first week.      PLEASE NOTE:  If you had any lab tests please let us know if you have not heard back within a few days. You may see your results on MyChart before we have a chance to review them but we will give you a call once they are reviewed by [place]. If we ordered any referrals today, please let us know if you have not heard from their office within the next week.

## 2022-11-25 NOTE — Progress Notes (Signed)
Patient ID: Amber Morales, female    DOB: October 30, 1985, 37 y.o.   MRN: [id number]  Chief Complaint  Patient presents with   Diabetes    Follow up    HPI: Hyperlipidemia: Patient is currently maintained on the following medication for hyperlipidemia: none, lifestyle modificatons.  Patient reports good compliance with low fat/low cholesterol diet. Exercising 3-4d/week. Losing weight. Last lipid panel as follows: Lab Results  Component Value Date   CHOL 189 08/25/2022   HDL 51.70 08/25/2022   LDLCALC 127 (H) 08/25/2022   TRIG 54.0 08/25/2022   CHOLHDL 4 08/25/2022   T2DM: Pt is currently maintained on the following medications for diabetes: Mounjaro 12.5mg  Failed meds include: none Denies polyuria/polydipsia/polyphagia Denies hypoglycemia Home glucose readings range: not checking.  Migraines/Headache:  taking a lot of Excedrin migraine, tylenol, NSAIDs, alternates doses, occurring about 3-4 days per week. Zyrtec D as well. Sometimes wakes up with pain, sometimes starts later. Forehead and behind her eyes mostly. Denies vision changes.    Assessment & Plan:  Type 2 diabetes mellitus with morbid obesity (HCC) Assessment & Plan: chronic Mounjaro 12.5mg  weekly, wt down 9 more lbs reports some heartburn, controlled with OTC med sending 12.5mg  refill f/u 3 mos    Orders: -     Tirzepatide; Inject 12.5 mg into the skin once a week.  Dispense: 6 mL; Refill: 0  Migraine without aura and without status migrainosus, not intractable Assessment & Plan: chronic, Unstable sinus headaches w/change of season and allergy seasons- turn into migraines taking Flonase bid, Zyrtec-D, Tylenol and drinks coffee for the caffeine which eases the HA 3-4 per week starting Imitrex 50mg  w/Flexeril 5-10mg , repeat @2h  prn also sending Metoprolol 12.5mg  at bedtime  advised on use & SE of meds f/u 3 mos or prn  Orders: -     Cyclobenzaprine HCl; Take 1-2 tablets (5-10 mg total) by mouth 3 (three)  times daily as needed for muscle spasms (Migraine). Take at first sign of migraine with Sumatriptan  Dispense: 30 tablet; Refill: 2 -     SUMAtriptan Succinate; Take 1-2 tablets (50-100 mg total) by mouth as directed. Take first dose with 1-2 Flexeril tabs. May repeat in 2 hours if headache persists or recurs. Max dose 4 pills in 24 hours.  Dispense: 30 tablet; Refill: 2 -     Metoprolol Succinate ER; Take 0.5-1 tablets (12.5-25 mg total) by mouth at bedtime. For migraine prevention. Start with 1/2 pill, after 2 weeks increase to 1 pill if tolerated.  Dispense: 30 tablet; Refill: 2  Mixed hyperlipidemia Assessment & Plan: chronic managing w/lifestyle mod rechecking today w/apolipoproteins d/t mother w/high lipids also & exercises, not obese f/u 6 mos  Orders: -     Cardio IQ (R) Advanced Lipid Panel  Need for hepatitis C screening test -     Hepatitis C antibody    Subjective:    Outpatient Medications Prior to Visit  Medication Sig Dispense Refill   acetaminophen (TYLENOL) 500 MG tablet Take 1,000 mg by mouth as needed for headache.     Cetirizine HCl (ZYRTEC ALLERGY) 10 MG CAPS 10 mg daily.     tirzepatide (MOUNJARO) 12.5 MG/0.5ML Pen Inject 12.5 mg into the skin once a week. 6 mL 0   No facility-administered medications prior to visit.   Past Medical History:  Diagnosis Date   Abnormal Pap smear    LAST PAPP 04/2011   Allergic rhinitis    Anemia    CHRONIC   Breast mass  07/26/2016   rt lump near lt axilla   Diabetes mellitus without complication (HCC)    Fracture of arm CHILDHOOD   Fracture of wrist CHILDHOOD   Gestational diabetes    glyburide   Hx of PTL (preterm labor), current pregnancy 04/29/2012   2nd pg PTL at 32 week del at 38 weeks   PONV (postoperative nausea and vomiting)    Pregnant state, incidental 04/28/2012   Preterm labor 2008   Vaginal delivery 10/27/2012   Past Surgical History:  Procedure Laterality Date   ABLATION     DILATION AND CURETTAGE OF  UTERUS  04/26/2009   LAPAROSCOPIC TUBAL LIGATION Bilateral 12/01/2012   Procedure: LAPAROSCOPIC TUBAL LIGATION;  Surgeon: Kirkland Hun, MD;  Location: [facility];  Service: Gynecology;  Laterality: Bilateral;   Allergies  Allergen Reactions   Ibuprofen Hives      Objective:    Physical Exam Vitals and nursing note reviewed.  Constitutional:      Appearance: Normal appearance. She is obese.  Cardiovascular:     Rate and Rhythm: Normal rate and regular rhythm.  Pulmonary:     Effort: Pulmonary effort is normal.     Breath sounds: Normal breath sounds.  Musculoskeletal:        General: Normal range of motion.  Skin:    General: Skin is warm and dry.  Neurological:     Mental Status: She is alert.  Psychiatric:        Mood and Affect: Mood normal.        Behavior: Behavior normal.    BP 100/66   Pulse 86   Temp 98 F (36.7 C) (Temporal)   Ht 5\' 6"  (1.676 m)   Wt 193 lb 3.2 oz (87.6 kg)   SpO2 100%   BMI 31.18 kg/m  Wt Readings from Last 3 Encounters:  11/25/22 193 lb 3.2 oz (87.6 kg)  08/25/22 204 lb (92.5 kg)  05/17/22 211 lb 2 oz (95.8 kg)      [name], NP

## 2022-11-25 NOTE — Assessment & Plan Note (Signed)
chronic managing w/lifestyle mod rechecking today w/apolipoproteins d/t mother w/high lipids also & exercises, not obese f/u 6 mos

## 2022-11-25 NOTE — Assessment & Plan Note (Addendum)
chronic Mounjaro 12.5mg  weekly, wt down 9 more lbs reports some heartburn, controlled with OTC med sending 12.5mg  refill f/u 3 mos

## 2022-12-08 ENCOUNTER — Encounter: Payer: Self-pay | Admitting: Family

## 2022-12-08 DIAGNOSIS — E782 Mixed hyperlipidemia: Secondary | ICD-10-CM

## 2022-12-12 MED ORDER — ROSUVASTATIN CALCIUM 5 MG PO TABS
5.0000 mg | ORAL_TABLET | ORAL | 1 refills | Status: DC
Start: 2022-12-12 — End: 2023-06-09

## 2023-02-20 ENCOUNTER — Other Ambulatory Visit: Payer: Self-pay | Admitting: Family

## 2023-02-20 DIAGNOSIS — G43009 Migraine without aura, not intractable, without status migrainosus: Secondary | ICD-10-CM

## 2023-02-23 ENCOUNTER — Ambulatory Visit (INDEPENDENT_AMBULATORY_CARE_PROVIDER_SITE_OTHER): Payer: 59 | Admitting: Family

## 2023-02-23 ENCOUNTER — Encounter: Payer: Self-pay | Admitting: Family

## 2023-02-23 DIAGNOSIS — Z23 Encounter for immunization: Secondary | ICD-10-CM

## 2023-02-23 DIAGNOSIS — G43009 Migraine without aura, not intractable, without status migrainosus: Secondary | ICD-10-CM

## 2023-02-23 DIAGNOSIS — E1169 Type 2 diabetes mellitus with other specified complication: Secondary | ICD-10-CM

## 2023-02-23 DIAGNOSIS — Z7985 Long-term (current) use of injectable non-insulin antidiabetic drugs: Secondary | ICD-10-CM

## 2023-02-23 MED ORDER — METOPROLOL SUCCINATE ER 25 MG PO TB24: 12.5000 mg | ORAL_TABLET | Freq: Every day | ORAL | 0 refills | Status: DC

## 2023-02-23 MED ORDER — TIRZEPATIDE 12.5 MG/0.5ML ~~LOC~~ SOAJ: 12.5000 mg | SUBCUTANEOUS | 0 refills | Status: DC

## 2023-02-23 NOTE — Assessment & Plan Note (Addendum)
chronic, stable sinus headaches w/change of season and allergy seasons- turn into migraines Imitrex 50mg  w/Flexeril 5-10mg , pt reports working well also sending Metoprolol 12.5mg  at bedtime, HA are less, but reports occasional lightheadedness advised to go back to 1/2 pill of Metoprolol qhs, let me know if HA occurrence increases again, refill sent f/u 6 mos

## 2023-02-23 NOTE — Progress Notes (Signed)
Patient ID: Amber Morales, female    DOB: 1985-05-07, 37 y.o.   MRN: [id number]  Chief Complaint  Patient presents with   Diabetes  HPI: T2DM: Pt is currently maintained on the following medications for diabetes: Mounjaro 12.5mg  Failed meds include: none Denies polyuria/polydipsia/polyphagia Denies hypoglycemia Home glucose readings range: not checking.  Migraines/Headache:  per last visit:  taking a lot of Excedrin migraine, tylenol, NSAIDs, alternates doses, occurring about 3-4 days per week. Zyrtec D as well. Sometimes wakes up with pain, sometimes starts later. Forehead and behind her eyes mostly. Denies vision changes. Started on Imitrex w/Flexeril and Metoprolol last visit, pt reports occasional lightheadedness upon standing too fast. *Today -States meds are working, having less often and resolving quicker.  Assessment & Plan:  Type 2 diabetes mellitus with morbid obesity (HCC) Assessment & Plan: chronic Mounjaro 12.5mg  weekly, wt down 5 more lbs reports some heartburn, controlled with OTC med, using Miralax most days sending 12.5mg  refill f/u 3 mos    Orders: -     Tirzepatide; Inject 12.5 mg into the skin once a week.  Dispense: 6 mL; Refill: 0 -     HM DIABETES FOOT EXAM  Migraine without aura and without status migrainosus, not intractable Assessment & Plan: chronic, stable sinus headaches w/change of season and allergy seasons- turn into migraines Imitrex 50mg  w/Flexeril 5-10mg , pt reports working well also sending Metoprolol 12.5mg  at bedtime, HA are less, but reports occasional lightheadedness advised to go back to 1/2 pill of Metoprolol qhs, let me know if HA occurrence increases again, refill sent f/u 6 mos   Orders: -     Metoprolol Succinate ER; Take 0.5-1 tablets (12.5-25 mg total) by mouth at bedtime. For migraine prevention. Start with 1/2 pill, after 2 weeks increase to 1 pill if tolerated.  Dispense: 90 tablet; Refill: 0  Immunization due -     Flu  vaccine trivalent PF, 6mos and older(Flulaval,Afluria,Fluarix,Fluzone) -     Tdap vaccine greater than or equal to 7yo IM   Subjective:    Outpatient Medications Prior to Visit  Medication Sig Dispense Refill   acetaminophen (TYLENOL) 500 MG tablet Take 1,000 mg by mouth as needed for headache.     Cetirizine HCl (ZYRTEC ALLERGY) 10 MG CAPS 10 mg daily.     cyclobenzaprine (FLEXERIL) 5 MG tablet Take 1-2 tablets (5-10 mg total) by mouth 3 (three) times daily as needed for muscle spasms (Migraine). Take at first sign of migraine with Sumatriptan 30 tablet 2   rosuvastatin (CRESTOR) 5 MG tablet Take 1 tablet (5 mg total) by mouth as directed. Start with 1 pill on Monday,Wed, Fri. for 2 weeks, then increase to 1 pill qod x 2w, then 1 pill qd 90 tablet 1   SUMAtriptan (IMITREX) 50 MG tablet Take 1-2 tablets (50-100 mg total) by mouth as directed. Take first dose with 1-2 Flexeril tabs. May repeat in 2 hours if headache persists or recurs. Max dose 4 pills in 24 hours. 30 tablet 2   metoprolol succinate (TOPROL-XL) 25 MG 24 hr tablet TAKE 0.5-1 TABLETS (12.5-25 MG TOTAL) BY MOUTH AT BEDTIME. FOR MIGRAINE PREVENTION. START WITH 1/2 PILL, AFTER 2 WEEKS INCREASE TO 1 PILL IF TOLERATED. 90 tablet 0   tirzepatide (MOUNJARO) 12.5 MG/0.5ML Pen Inject 12.5 mg into the skin once a week. 6 mL 0   No facility-administered medications prior to visit.   Past Medical History:  Diagnosis Date   Abnormal Pap smear    LAST  PAPP 04/2011   Allergic rhinitis    Anemia    CHRONIC   Breast mass 07/26/2016   rt lump near lt axilla   Diabetes mellitus without complication (HCC)    Fracture of arm CHILDHOOD   Fracture of wrist CHILDHOOD   Gestational diabetes    glyburide   Hx of PTL (preterm labor), current pregnancy 04/29/2012   2nd pg PTL at 32 week del at 38 weeks   PONV (postoperative nausea and vomiting)    Pregnant state, incidental 04/28/2012   Preterm labor 2008   Vaginal delivery 10/27/2012   Past  Surgical History:  Procedure Laterality Date   ABLATION     DILATION AND CURETTAGE OF UTERUS  04/26/2009   LAPAROSCOPIC TUBAL LIGATION Bilateral 12/01/2012   Procedure: LAPAROSCOPIC TUBAL LIGATION;  Surgeon: Kirkland Hun, MD;  Location: [facility];  Service: Gynecology;  Laterality: Bilateral;   Allergies  Allergen Reactions   Ibuprofen Hives      Objective:    Physical Exam Vitals and nursing note reviewed.  Constitutional:      Appearance: Normal appearance.  Cardiovascular:     Rate and Rhythm: Normal rate and regular rhythm.  Pulmonary:     Effort: Pulmonary effort is normal.     Breath sounds: Normal breath sounds.  Musculoskeletal:        General: Normal range of motion.  Feet:     Right foot:     Protective Sensation: 10 sites tested.  10 sites sensed.     Skin integrity: Skin integrity normal.     Toenail Condition: Right toenails are normal.     Left foot:     Protective Sensation: 10 sites tested.  10 sites sensed.     Skin integrity: Skin integrity normal.     Toenail Condition: Left toenails are normal.  Skin:    General: Skin is warm and dry.  Neurological:     Mental Status: She is alert.  Psychiatric:        Mood and Affect: Mood normal.        Behavior: Behavior normal.    Temp 98 F (36.7 C) (Temporal)   Ht 5\' 6"  (1.676 m)   Wt 188 lb (85.3 kg)   BMI 30.34 kg/m  Wt Readings from Last 3 Encounters:  02/23/23 188 lb (85.3 kg)  11/25/22 193 lb 3.2 oz (87.6 kg)  08/25/22 204 lb (92.5 kg)      [name], NP

## 2023-02-23 NOTE — Assessment & Plan Note (Signed)
chronic Mounjaro 12.5mg  weekly, wt down 5 more lbs reports some heartburn, controlled with OTC med, using Miralax most days sending 12.5mg  refill f/u 3 mos

## 2023-02-25 ENCOUNTER — Ambulatory Visit: Payer: 59 | Admitting: Family

## 2023-05-26 ENCOUNTER — Ambulatory Visit: Payer: 59 | Admitting: Family

## 2023-05-30 ENCOUNTER — Ambulatory Visit (INDEPENDENT_AMBULATORY_CARE_PROVIDER_SITE_OTHER): Payer: 59 | Admitting: Family

## 2023-05-30 ENCOUNTER — Encounter: Payer: Self-pay | Admitting: Family

## 2023-05-30 VITALS — BP 105/71 | HR 67 | Temp 97.4°F | Ht 66.0 in | Wt 185.5 lb

## 2023-05-30 DIAGNOSIS — G43009 Migraine without aura, not intractable, without status migrainosus: Secondary | ICD-10-CM

## 2023-05-30 DIAGNOSIS — E1169 Type 2 diabetes mellitus with other specified complication: Secondary | ICD-10-CM | POA: Diagnosis not present

## 2023-05-30 DIAGNOSIS — R635 Abnormal weight gain: Secondary | ICD-10-CM

## 2023-05-30 DIAGNOSIS — Z7985 Long-term (current) use of injectable non-insulin antidiabetic drugs: Secondary | ICD-10-CM | POA: Diagnosis not present

## 2023-05-30 MED ORDER — TIRZEPATIDE 15 MG/0.5ML ~~LOC~~ SOAJ: 15.0000 mg | SUBCUTANEOUS | 0 refills | Status: DC

## 2023-05-30 NOTE — Progress Notes (Signed)
Patient ID: Amber Morales, female    DOB: [birth year], 38 y.o.   MRN: [id number]  Chief Complaint  Patient presents with   Diabetes       Discussed the use of AI scribe software for clinical note transcription with the patient, who gave verbal consent to proceed.  History of Present Illness   The patient presents for follow-up regarding weight management and migraine control. She has been on [facility] for six months, experiencing weight loss and tolerating the current dose well. She is considering increasing the dose to 15 mg. She experiences heartburn of varying intensity and avoids acidic foods. She has adjusted her eating habits to include three meals a day, including breakfast, after noticing a decreased appetite during the holiday season. No significant nausea with the current medication dose. For migraine control, she takes metoprolol nightly, which has improved her headaches. Imitrex is effective at the first sign of a headache, leading to significant improvement compared to when she first started the medication regimen.    Assessment & Plan:     T2DM w/Obesity - Significant weight loss achieved with Mounjaro, tolerating current dose well. BMI now below 30. Discussed importance of maintaining healthy eating habits and regular exercise. SE of heartburn is tolerable, sometimes more severe. Patient is mindful of diet. -Increase Mounjaro to 15mg , the highest dose. -Continue healthy eating habits, remembering to eat 2-3 meals/day, and regular exercise. -Ok to continue OTC Prilosec prn for heartburn, watch acidic foods in diet. -Follow-up in 3 months. Consider cholesterol check at next visit or the one after.  Migraines - Improvement noted with nightly half-dose of Metoprolol. Imitrex effective at first sign of headache. -Continue Metoprolol 12.5mg  (half-dose) nightly. -Use Imitrex and/or Flexeril at first sign of headache. -F/U 6mos or prn  Gastroesophageal Reflux Disease  (GERD) -Continue current management, avoid acidic foods.  -F/U prn   Subjective:    Outpatient Medications Prior to Visit  Medication Sig Dispense Refill   acetaminophen (TYLENOL) 500 MG tablet Take 1,000 mg by mouth as needed for headache.     Cetirizine HCl (ZYRTEC ALLERGY) 10 MG CAPS 10 mg daily.     cyclobenzaprine (FLEXERIL) 5 MG tablet Take 1-2 tablets (5-10 mg total) by mouth 3 (three) times daily as needed for muscle spasms (Migraine). Take at first sign of migraine with Sumatriptan 30 tablet 2   metoprolol succinate (TOPROL-XL) 25 MG 24 hr tablet Take 0.5-1 tablets (12.5-25 mg total) by mouth at bedtime. For migraine prevention. Start with 1/2 pill, after 2 weeks increase to 1 pill if tolerated. 90 tablet 0   rosuvastatin (CRESTOR) 5 MG tablet Take 1 tablet (5 mg total) by mouth as directed. Start with 1 pill on Monday,Wed, Fri. for 2 weeks, then increase to 1 pill qod x 2w, then 1 pill qd 90 tablet 1   SUMAtriptan (IMITREX) 50 MG tablet Take 1-2 tablets (50-100 mg total) by mouth as directed. Take first dose with 1-2 Flexeril tabs. May repeat in 2 hours if headache persists or recurs. Max dose 4 pills in 24 hours. 30 tablet 2   tirzepatide (MOUNJARO) 12.5 MG/0.5ML Pen Inject 12.5 mg into the skin once a week. 6 mL 0   No facility-administered medications prior to visit.   Past Medical History:  Diagnosis Date   Abnormal Pap smear    LAST PAPP 04/2011   Allergic rhinitis    Anemia    CHRONIC   Breast mass 07/26/2016   rt lump near lt axilla  Diabetes mellitus without complication (HCC)    Fracture of arm CHILDHOOD   Fracture of wrist CHILDHOOD   Gestational diabetes    glyburide   Hx of PTL (preterm labor), current pregnancy 04/29/2012   2nd pg PTL at 32 week del at 38 weeks   PONV (postoperative nausea and vomiting)    Pregnant state, incidental 04/28/2012   Preterm labor 2008   Vaginal delivery 10/27/2012   Past Surgical History:  Procedure Laterality Date   ABLATION      DILATION AND CURETTAGE OF UTERUS  04/26/2009   LAPAROSCOPIC TUBAL LIGATION Bilateral 12/01/2012   Procedure: LAPAROSCOPIC TUBAL LIGATION;  Surgeon: Kirkland Hun, MD;  Location: [facility];  Service: Gynecology;  Laterality: Bilateral;   Allergies  Allergen Reactions   Ibuprofen Hives      Objective:    Physical Exam Vitals and nursing note reviewed.  Constitutional:      Appearance: Normal appearance.  Cardiovascular:     Rate and Rhythm: Normal rate and regular rhythm.  Pulmonary:     Effort: Pulmonary effort is normal.     Breath sounds: Normal breath sounds.  Musculoskeletal:        General: Normal range of motion.  Skin:    General: Skin is warm and dry.  Neurological:     Mental Status: She is alert.  Psychiatric:        Mood and Affect: Mood normal.        Behavior: Behavior normal.    BP 105/71 (BP Location: Left Arm, Patient Position: Sitting)   Pulse 67   Temp (!) 97.4 F (36.3 C) (Temporal)   Ht 5\' 6"  (1.676 m)   Wt 185 lb 8 oz (84.1 kg)   SpO2 100%   BMI 29.94 kg/m  Wt Readings from Last 3 Encounters:  05/30/23 185 lb 8 oz (84.1 kg)  02/23/23 188 lb (85.3 kg)  11/25/22 193 lb 3.2 oz (87.6 kg)      [name], NP

## 2023-05-30 NOTE — Assessment & Plan Note (Signed)
Improvement noted with nightly half-dose of Metoprolol. Imitrex effective at first sign of headache. -Continue Metoprolol half-dose nightly. -Use Imitrex and/or Flexeril at first sign of headache. -F/U 6mos or prn Gastroesophageal Reflux Disease (GERD) -Continue current management, avoid acidic foods.  -F/U 6mos

## 2023-05-30 NOTE — Assessment & Plan Note (Signed)
Significant weight loss achieved with Mounjaro, tolerating current dose well. BMI now below 30. Discussed importance of maintaining healthy eating habits and regular exercise. SE of heartburn is tolerable, sometimes more severe. Patient is mindful of diet. -Increase Mounjaro to 15mg , the highest dose. -Continue healthy eating habits, remembering to eat 2-3 meals/day, and regular exercise. -Ok to continue OTC Prilosec prn for heartburn, watch acidic foods in diet. -Follow-up in 3 months. Consider cholesterol check at next visit or the one after.

## 2023-06-09 ENCOUNTER — Other Ambulatory Visit: Payer: Self-pay | Admitting: Family

## 2023-06-09 DIAGNOSIS — G43009 Migraine without aura, not intractable, without status migrainosus: Secondary | ICD-10-CM

## 2023-06-09 DIAGNOSIS — E782 Mixed hyperlipidemia: Secondary | ICD-10-CM

## 2023-06-22 ENCOUNTER — Ambulatory Visit: Payer: 59 | Admitting: Obstetrics and Gynecology

## 2023-06-22 DIAGNOSIS — Z124 Encounter for screening for malignant neoplasm of cervix: Secondary | ICD-10-CM

## 2023-06-22 DIAGNOSIS — Z01419 Encounter for gynecological examination (general) (routine) without abnormal findings: Secondary | ICD-10-CM

## 2023-07-07 LAB — HM DIABETES EYE EXAM

## 2023-07-14 ENCOUNTER — Ambulatory Visit (INDEPENDENT_AMBULATORY_CARE_PROVIDER_SITE_OTHER): Payer: 59 | Admitting: Obstetrics and Gynecology

## 2023-07-14 ENCOUNTER — Encounter: Payer: Self-pay | Admitting: Obstetrics and Gynecology

## 2023-07-14 ENCOUNTER — Other Ambulatory Visit (HOSPITAL_COMMUNITY)
Admission: RE | Admit: 2023-07-14 | Discharge: 2023-07-14 | Disposition: A | Discharge status: Home / Self Care | Source: Ambulatory Visit | Attending: Obstetrics and Gynecology | Admitting: Obstetrics and Gynecology

## 2023-07-14 VITALS — BP 99/66 | HR 89 | Ht 66.0 in | Wt 189.1 lb

## 2023-07-14 DIAGNOSIS — Z124 Encounter for screening for malignant neoplasm of cervix: Secondary | ICD-10-CM | POA: Insufficient documentation

## 2023-07-14 DIAGNOSIS — Z1231 Encounter for screening mammogram for malignant neoplasm of breast: Secondary | ICD-10-CM

## 2023-07-14 DIAGNOSIS — Z01419 Encounter for gynecological examination (general) (routine) without abnormal findings: Secondary | ICD-10-CM | POA: Insufficient documentation

## 2023-07-14 NOTE — Progress Notes (Signed)
 HPI:      [name] is a 38 y.o. [phone] who LMP was Patient's last menstrual period was 07/02/2023.  Subjective:   She presents today for her annual examination.  She states that despite a previous endometrial ablation she has begun having menses lasting 2 days/month.  She has significant dysmenorrhea with this.  She also complains of a lot of pain at the time of ovulation.  She reports being previously diagnosed with a small fibroid at the time of ablation. She has a tubal ligation for birth control.    Hx: The following portions of the patient's history were reviewed and updated as appropriate:             She  has a past medical history of Abnormal Pap smear, Allergic rhinitis, Anemia, Breast mass (07/26/2016), Diabetes mellitus without complication (HCC), Fracture of arm (CHILDHOOD), Fracture of wrist (CHILDHOOD), Gestational diabetes, PTL (preterm labor), current pregnancy (04/29/2012), PONV (postoperative nausea and vomiting), Pregnant state, incidental (04/28/2012), Preterm labor (2008), and Vaginal delivery (10/27/2012). She does not have any pertinent problems on file. She  has a past surgical history that includes Dilation and curettage of uterus (04/26/2009); Laparoscopic tubal ligation (Bilateral, 12/01/2012); and Ablation. Her family history includes Arthritis in her maternal aunt; Asthma in her father, maternal grandmother, and sister; Birth defects in her cousin; Heart disease in her paternal grandmother; Hyperlipidemia in her mother; Hypertension in her paternal grandmother; Other in her mother; Stroke in her maternal grandfather. She  reports that she has never smoked. She has never used smokeless tobacco. She reports that she does not drink alcohol and does not use drugs. She has a current medication list which includes the following prescription(s): acetaminophen, zyrtec allergy, cyclobenzaprine, metoprolol succinate, rosuvastatin, sumatriptan, and tirzepatide. She is allergic  to ibuprofen.       Review of Systems:  Review of Systems  Constitutional: Denied constitutional symptoms, night sweats, recent illness, fatigue, fever, insomnia and weight loss.  Eyes: Denied eye symptoms, eye pain, photophobia, vision change and visual disturbance.  Ears/Nose/Throat/Neck: Denied ear, nose, throat or neck symptoms, hearing loss, nasal discharge, sinus congestion and sore throat.  Cardiovascular: Denied cardiovascular symptoms, arrhythmia, chest pain/pressure, edema, exercise intolerance, orthopnea and palpitations.  Respiratory: Denied pulmonary symptoms, asthma, pleuritic pain, productive sputum, cough, dyspnea and wheezing.  Gastrointestinal: Denied, gastro-esophageal reflux, melena, nausea and vomiting.  Genitourinary: See HPI for additional information.  Musculoskeletal: Denied musculoskeletal symptoms, stiffness, swelling, muscle weakness and myalgia.  Dermatologic: Denied dermatology symptoms, rash and scar.  Neurologic: Denied neurology symptoms, dizziness, headache, neck pain and syncope.  Psychiatric: Denied psychiatric symptoms, anxiety and depression.  Endocrine: Denied endocrine symptoms including hot flashes and night sweats.   Meds:   Current Outpatient Medications on File Prior to Visit  Medication Sig Dispense Refill   acetaminophen (TYLENOL) 500 MG tablet Take 1,000 mg by mouth as needed for headache.     Cetirizine HCl (ZYRTEC ALLERGY) 10 MG CAPS 10 mg daily.     cyclobenzaprine (FLEXERIL) 5 MG tablet TAKE 1-2 TABLETS (5-10 MG TOTAL) BY MOUTH 3 (THREE) TIMES DAILY AS NEEDED FOR MUSCLE SPASMS (MIGRAINE). TAKE AT FIRST SIGN OF MIGRAINE WITH SUMATRIPTAN 30 tablet 2   metoprolol succinate (TOPROL-XL) 25 MG 24 hr tablet Take 0.5-1 tablets (12.5-25 mg total) by mouth at bedtime. For migraine prevention. Start with 1/2 pill, after 2 weeks increase to 1 pill if tolerated. 90 tablet 0   rosuvastatin (CRESTOR) 5 MG tablet TAKE 1 TABLET 3 DAYS A  WEEK (MONDAY,  WEDNESDAY AND FRIDAY) FOR 14 DAYS THEN TAKE TAKE 1 TABLET BY MOUTH EVERY OTHER DAY FOR 14 DAYS THEN TAKE 1 TABLET BY MOUTH DAILY 90 tablet 1   SUMAtriptan (IMITREX) 50 MG tablet Take 1-2 tablets (50-100 mg total) by mouth as directed. Take first dose with 1-2 Flexeril tabs. May repeat in 2 hours if headache persists or recurs. Max dose 4 pills in 24 hours. 30 tablet 2   tirzepatide (MOUNJARO) 15 MG/0.5ML Pen Inject 15 mg into the skin once a week. 6 mL 0   No current facility-administered medications on file prior to visit.     Objective:     Vitals:   07/14/23 0827  BP: 99/66  Pulse: 89    Filed Weights   07/14/23 0827  Weight: 189 lb 1.6 oz (85.8 kg)              Physical examination General NAD, Conversant  HEENT Atraumatic; Op clear with mmm.  Normo-cephalic.  Anicteric sclerae  Thyroid/Neck Smooth without nodularity or enlargement. Normal ROM.  Neck Supple.  Skin No rashes, lesions or ulceration. Normal palpated skin turgor. No nodularity.  Breasts: No masses or discharge.  Symmetric.  No axillary adenopathy.  Lungs: Clear to auscultation.No rales or wheezes. Normal Respiratory effort, no retractions.  Heart: NSR.  No murmurs or rubs appreciated. No peripheral edema  Abdomen: Soft.  Non-tender.  No masses.  No HSM. No hernia  Extremities: Moves all appropriately.  Normal ROM for age. No lymphadenopathy.  Neuro: Oriented to PPT.  Normal mood. Normal affect.     Pelvic:   Vulva: Normal appearance.  No lesions.  Vagina: No lesions or abnormalities noted.  Support: Normal pelvic support.  Urethra No masses tenderness or scarring.  Meatus Normal size without lesions or prolapse.  Cervix: Normal appearance.  No lesions.  Anus: Normal exam.  No lesions.  Perineum: Normal exam.  No lesions.        Bimanual   Uterus: Normal size.  Non-tender.  Mobile.  AV.  Adnexae: No masses.  Non-tender to palpation.  Cul-de-sac: Negative for abnormality.     Assessment:     [id number] Patient Active Problem List   Diagnosis Date Noted   Mixed hyperlipidemia 11/25/2022   Type 2 diabetes mellitus with morbid obesity (HCC) 07/22/2021   Abnormal weight gain 07/22/2021   Migraine without aura and without status migrainosus, not intractable 07/06/2021   Allergy to drug 07/30/2016     1. Well woman exam with routine gynecological exam   2. Cervical cancer screening   3. Screening mammogram for breast cancer     Normal exam  Endometrial ablation not working well at this time as patient has significant dysmenorrhea and midcycle pain with ovulation.   Plan:            1.  Basic Screening Recommendations The basic screening recommendations for asymptomatic women were discussed with the patient during her visit.  The age-appropriate recommendations were discussed with her and the rational for the tests reviewed.  When I am informed by the patient that another primary care physician has previously obtained the age-appropriate tests and they are up-to-date, only outstanding tests are ordered and referrals given as necessary.  Abnormal results of tests will be discussed with her when all of her results are completed.  Routine preventative health maintenance measures emphasized: Exercise/Diet/Weight control, Tobacco Warnings, Alcohol/Substance use risks and Stress Management Pap performed-mammogram ordered 2.  Option of cycle control with OCPs  to prevent ovulation as well as decreased menstrual bleeding possibly decreasing menstrual cramping.  We could also use a 5-month OCP to space out her menses.  We have also discussed the option of hysterectomy as she has failed a major procedure to control her dysmenorrhea.  She says that she would like to consider these options and get back to [place] in the future.  Orders Orders Placed This Encounter  Procedures   MM DIGITAL SCREENING BILATERAL    No orders of the defined types were placed in this encounter.         F/U  Return in  about 1 year (around 07/13/2024) for Annual Physical.  Elonda Husky, M.D. 07/14/2023 9:02 AM

## 2023-07-14 NOTE — Progress Notes (Signed)
 Patients presents for annual exam today. She states she would like to discuss her pain with monthly cycle and fibroids. Due for pap smear, ordered. Mammogram ordered. Annual labs are deferred to PCP. She states no other questions or concerns at this time.

## 2023-07-19 LAB — CYTOLOGY - PAP
Comment: NEGATIVE
Diagnosis: NEGATIVE
High risk HPV: NEGATIVE

## 2023-08-07 ENCOUNTER — Encounter: Payer: Self-pay | Admitting: Obstetrics and Gynecology

## 2023-08-07 DIAGNOSIS — Z30011 Encounter for initial prescription of contraceptive pills: Secondary | ICD-10-CM

## 2023-08-07 DIAGNOSIS — Z01419 Encounter for gynecological examination (general) (routine) without abnormal findings: Secondary | ICD-10-CM

## 2023-08-08 MED ORDER — LEVONORGEST-ETH ESTRAD 91-DAY 0.15-0.03 &0.01 MG PO TABS: 1.0000 | ORAL_TABLET | Freq: Every day | ORAL | 1 refills | Status: DC

## 2023-08-16 ENCOUNTER — Ambulatory Visit
Admission: RE | Admit: 2023-08-16 | Discharge: 2023-08-16 | Disposition: A | Discharge status: Home / Self Care | Source: Ambulatory Visit | Attending: Obstetrics and Gynecology | Admitting: Obstetrics and Gynecology

## 2023-08-16 DIAGNOSIS — Z1231 Encounter for screening mammogram for malignant neoplasm of breast: Secondary | ICD-10-CM | POA: Insufficient documentation

## 2023-08-16 DIAGNOSIS — N6489 Other specified disorders of breast: Secondary | ICD-10-CM | POA: Insufficient documentation

## 2023-08-16 DIAGNOSIS — Z01419 Encounter for gynecological examination (general) (routine) without abnormal findings: Secondary | ICD-10-CM

## 2023-08-17 ENCOUNTER — Other Ambulatory Visit: Payer: Self-pay | Admitting: Family

## 2023-08-17 DIAGNOSIS — G43009 Migraine without aura, not intractable, without status migrainosus: Secondary | ICD-10-CM

## 2023-08-18 ENCOUNTER — Other Ambulatory Visit: Payer: Self-pay | Admitting: Obstetrics and Gynecology

## 2023-08-18 DIAGNOSIS — R928 Other abnormal and inconclusive findings on diagnostic imaging of breast: Secondary | ICD-10-CM

## 2023-08-22 ENCOUNTER — Other Ambulatory Visit: Payer: Self-pay | Admitting: Family

## 2023-08-22 DIAGNOSIS — E1169 Type 2 diabetes mellitus with other specified complication: Secondary | ICD-10-CM

## 2023-08-22 DIAGNOSIS — R635 Abnormal weight gain: Secondary | ICD-10-CM

## 2023-08-23 ENCOUNTER — Telehealth: Payer: Self-pay

## 2023-08-23 NOTE — Telephone Encounter (Signed)
 Pharmacy Patient Advocate Encounter   Received notification from CoverMyMeds that prior authorization for Mounjaro  15MG /0.5ML auto-injectors  is required/requested.   Insurance verification completed.   The patient is insured through [employer] [facility] .   Per test claim: PA required; PA submitted to above mentioned insurance via CoverMyMeds Key/confirmation #/EOC [facility] Status is pending

## 2023-08-24 ENCOUNTER — Ambulatory Visit
Admission: RE | Admit: 2023-08-24 | Discharge: 2023-08-24 | Disposition: A | Discharge status: Home / Self Care | Source: Ambulatory Visit | Attending: Obstetrics and Gynecology | Admitting: Obstetrics and Gynecology

## 2023-08-24 ENCOUNTER — Other Ambulatory Visit (HOSPITAL_COMMUNITY): Payer: Self-pay

## 2023-08-24 DIAGNOSIS — R928 Other abnormal and inconclusive findings on diagnostic imaging of breast: Secondary | ICD-10-CM

## 2023-08-24 NOTE — Telephone Encounter (Signed)
 Pharmacy Patient Advocate Encounter  Received notification from [facility] that Prior Authorization for Mounjaro  15MG /0.5ML auto-injectors  has been APPROVED from 08/23/23 to 08/23/26. Unable to obtain price due to refill too soon rejection, last fill date 08/23/23 next available fill date7/2/25   PA #/Case ID/Reference #: [id number]

## 2023-08-29 ENCOUNTER — Ambulatory Visit: Payer: 59 | Admitting: Family

## 2023-09-02 ENCOUNTER — Encounter (HOSPITAL_COMMUNITY): Payer: Self-pay

## 2023-09-06 ENCOUNTER — Ambulatory Visit: Admitting: Family

## 2023-09-09 ENCOUNTER — Ambulatory Visit: Admitting: Family

## 2023-09-21 ENCOUNTER — Ambulatory Visit: Admitting: Family

## 2023-09-27 ENCOUNTER — Ambulatory Visit: Admitting: Family

## 2023-10-04 NOTE — Progress Notes (Deleted)
 Patient ID: MARYAH MARINARO, female    DOB: 04-05-1986, 38 y.o.   MRN: [id number]  No chief complaint on file.           Assessment & Plan:   Subjective:     Outpatient Medications Prior to Visit  Medication Sig Dispense Refill   acetaminophen  (TYLENOL ) 500 MG tablet Take 1,000 mg by mouth as needed for headache.     Cetirizine HCl (ZYRTEC ALLERGY) 10 MG CAPS 10 mg daily.     cyclobenzaprine  (FLEXERIL ) 5 MG tablet TAKE 1-2 TABLETS (5-10 MG TOTAL) BY MOUTH 3 (THREE) TIMES DAILY AS NEEDED FOR MUSCLE SPASMS (MIGRAINE). TAKE AT FIRST SIGN OF MIGRAINE WITH SUMATRIPTAN  30 tablet 2   Levonorgestrel-Ethinyl Estradiol (AMETHIA) 0.15-0.03 &0.01 MG tablet Take 1 tablet by mouth at bedtime. 84 tablet 1   metoprolol  succinate (TOPROL -XL) 25 MG 24 hr tablet TAKE 1/2 TABLET AT BEDTIME FOR 2 WEEKS, THEN MAY INCREASE TO 1 TAB IF TOLERATED TO PREVENT MIGRAINES 90 tablet 0   rosuvastatin  (CRESTOR ) 5 MG tablet TAKE 1 TABLET 3 DAYS A WEEK (MONDAY, WEDNESDAY AND FRIDAY) FOR 14 DAYS THEN TAKE TAKE 1 TABLET BY MOUTH EVERY OTHER DAY FOR 14 DAYS THEN TAKE 1 TABLET BY MOUTH DAILY 90 tablet 1   SUMAtriptan  (IMITREX ) 50 MG tablet Take 1-2 tablets (50-100 mg total) by mouth as directed. Take first dose with 1-2 Flexeril  tabs. May repeat in 2 hours if headache persists or recurs. Max dose 4 pills in 24 hours. 30 tablet 2   tirzepatide  (MOUNJARO ) 15 MG/0.5ML Pen INJECT 15 MG INTO THE SKIN ONCE A WEEK. 6 mL 0   No facility-administered medications prior to visit.   Past Medical History:  Diagnosis Date   Abnormal Pap smear    LAST PAPP 04/2011   Allergic rhinitis    Anemia    CHRONIC   Breast mass 07/26/2016   rt lump near lt axilla   Diabetes mellitus without complication (HCC)    Fracture of arm CHILDHOOD   Fracture of wrist CHILDHOOD   Gestational diabetes    glyburide   Hx of PTL (preterm labor), current pregnancy 04/29/2012   2nd pg PTL at 32 week del at 38 weeks   PONV (postoperative nausea and vomiting)     Pregnant state, incidental 04/28/2012   Preterm labor 2008   Vaginal delivery 10/27/2012   Past Surgical History:  Procedure Laterality Date   ABLATION     DILATION AND CURETTAGE OF UTERUS  04/26/2009   LAPAROSCOPIC TUBAL LIGATION Bilateral 12/01/2012   Procedure: LAPAROSCOPIC TUBAL LIGATION;  Surgeon: Lula Sale, MD;  Location: [facility];  Service: Gynecology;  Laterality: Bilateral;   Allergies  Allergen Reactions   Ibuprofen  Hives      Objective:    Physical Exam Vitals and nursing note reviewed.  Constitutional:      Appearance: Normal appearance. She is obese.  Cardiovascular:     Rate and Rhythm: Normal rate and regular rhythm.  Pulmonary:     Effort: Pulmonary effort is normal.     Breath sounds: Normal breath sounds.  Musculoskeletal:        General: Normal range of motion.  Skin:    General: Skin is warm and dry.  Neurological:     Mental Status: She is alert.  Psychiatric:        Mood and Affect: Mood normal.        Behavior: Behavior normal.    There were no vitals taken for this  visit. Wt Readings from Last 3 Encounters:  07/14/23 189 lb 1.6 oz (85.8 kg)  05/30/23 185 lb 8 oz (84.1 kg)  02/23/23 188 lb (85.3 kg)       Versa Gore, NP

## 2023-10-06 ENCOUNTER — Ambulatory Visit: Admitting: Family

## 2023-10-06 DIAGNOSIS — E1169 Type 2 diabetes mellitus with other specified complication: Secondary | ICD-10-CM

## 2023-10-13 ENCOUNTER — Ambulatory Visit: Admitting: Family

## 2023-10-14 ENCOUNTER — Other Ambulatory Visit: Payer: Self-pay | Admitting: Family

## 2023-10-14 DIAGNOSIS — G43009 Migraine without aura, not intractable, without status migrainosus: Secondary | ICD-10-CM

## 2023-10-18 ENCOUNTER — Ambulatory Visit: Admitting: Family

## 2023-10-19 ENCOUNTER — Ambulatory Visit: Admitting: Family

## 2023-10-24 ENCOUNTER — Ambulatory Visit: Admitting: Family

## 2023-11-09 ENCOUNTER — Ambulatory Visit (INDEPENDENT_AMBULATORY_CARE_PROVIDER_SITE_OTHER): Admitting: Family

## 2023-11-09 ENCOUNTER — Encounter: Payer: Self-pay | Admitting: Family

## 2023-11-09 DIAGNOSIS — E1169 Type 2 diabetes mellitus with other specified complication: Secondary | ICD-10-CM | POA: Diagnosis not present

## 2023-11-09 DIAGNOSIS — E782 Mixed hyperlipidemia: Secondary | ICD-10-CM | POA: Diagnosis not present

## 2023-11-09 DIAGNOSIS — Z6829 Body mass index (BMI) 29.0-29.9, adult: Secondary | ICD-10-CM | POA: Diagnosis not present

## 2023-11-09 DIAGNOSIS — Z7985 Long-term (current) use of injectable non-insulin antidiabetic drugs: Secondary | ICD-10-CM | POA: Diagnosis not present

## 2023-11-09 DIAGNOSIS — G43009 Migraine without aura, not intractable, without status migrainosus: Secondary | ICD-10-CM

## 2023-11-09 DIAGNOSIS — R635 Abnormal weight gain: Secondary | ICD-10-CM

## 2023-11-09 DIAGNOSIS — R61 Generalized hyperhidrosis: Secondary | ICD-10-CM

## 2023-11-09 DIAGNOSIS — E7841 Elevated Lipoprotein(a): Secondary | ICD-10-CM | POA: Insufficient documentation

## 2023-11-09 LAB — HEMOGLOBIN A1C: Hgb A1c MFr Bld: 5 % (ref 4.6–6.5)

## 2023-11-09 LAB — COMPREHENSIVE METABOLIC PANEL WITH GFR
ALT: 14 U/L (ref 0–35)
AST: 16 U/L (ref 0–37)
Albumin: 4 g/dL (ref 3.5–5.2)
Alkaline Phosphatase: 30 U/L — ABNORMAL LOW (ref 39–117)
BUN: 12 mg/dL (ref 6–23)
CO2: 24 meq/L (ref 19–32)
Calcium: 8.9 mg/dL (ref 8.4–10.5)
Chloride: 106 meq/L (ref 96–112)
Creatinine, Ser: 0.76 mg/dL (ref 0.40–1.20)
GFR: 98.11 mL/min (ref >60.00)
Glucose, Bld: 76 mg/dL (ref 70–99)
Potassium: 4.1 meq/L (ref 3.5–5.1)
Sodium: 136 meq/L (ref 135–145)
Total Bilirubin: 0.8 mg/dL (ref 0.2–1.2)
Total Protein: 7 g/dL (ref 6.0–8.3)

## 2023-11-09 LAB — LIPID PANEL
Cholesterol: 130 mg/dL (ref 0–200)
HDL: 56.4 mg/dL (ref >39.00)
LDL Cholesterol: 64 mg/dL (ref 0–99)
NonHDL: 73.9
Total CHOL/HDL Ratio: 2
Triglycerides: 51 mg/dL (ref 0.0–149.0)
VLDL: 10.2 mg/dL (ref 0.0–40.0)

## 2023-11-09 LAB — MICROALBUMIN / CREATININE URINE RATIO
Creatinine,U: 79.8 mg/dL
Microalb Creat Ratio: 14.3 mg/g (ref 0.0–30.0)
Microalb, Ur: 1.1 mg/dL (ref 0.0–1.9)

## 2023-11-09 MED ORDER — METOPROLOL SUCCINATE ER 25 MG PO TB24: 25.0000 mg | ORAL_TABLET | Freq: Every day | ORAL | 1 refills | Status: DC

## 2023-11-09 MED ORDER — MOUNJARO 15 MG/0.5ML ~~LOC~~ SOAJ: 15.0000 mg | SUBCUTANEOUS | 0 refills | Status: DC

## 2023-11-09 NOTE — Assessment & Plan Note (Signed)
 Elevated Lipoprotein A & B managing w/lifestyle mod, has had significant weight loss started Crestor  5mg  qd after last elevated lipids, pt tolerating rechecking lipid panel  today f/u 6 mos

## 2023-11-09 NOTE — Progress Notes (Signed)
 Patient ID: Amber Morales, female    DOB: August 18, 1985, 38 y.o.   MRN: [id number]  Chief Complaint  Patient presents with   Diabetes   Migraine  Discussed the use of AI scribe software for clinical note transcription with the patient, who gave verbal consent to proceed.  History of Present Illness Amber Morales is a 38 year old female with type 2 diabetes who presents for a routine follow-up and lab work.  Type 2 diabetes mellitus - On Mounjaro  for one year, currently at 15 mg dose - No weight loss in the past six months - Hesitant to increase Mounjaro  dose due to prior side effects (nausea and heartburn) that lasted two months after increasing from 12.5 mg to 15 mg - Last hemoglobin A1c was 6.6 over a year ago - Undergoing routine laboratory monitoring including cholesterol, metabolic panel, liver, and kidney function tests due to medication use  Migraines - On metoprolol , taking a full pill at bedtime - Metoprolol  is effective for headache control  Vasomotor symptoms and menstrual management - Significant night sweats, waking up drenched in sweat - Night sweats began after starting birth control pill (Amethia) three months ago for cycle-related pain - No hot flashes - Cold during the day, sweating at night - History of endometrial ablation - Birth control has improved cycle-related pain but not night sweats   Assessment & Plan Type 2 Diabetes Mellitus A1c likely improved due to weight loss. Insurance coverage ongoing due to diabetes diagnosis. Highest dose of Mounjaro  causing appetite suppression but no significant weight loss. Also does increase caused nausea and heartburn, resolved after two months. Discussed maintaining muscle mass during weight loss and potential for continued insurance coverage despite normal A1c. - Continue Mounjaro  at 15 mg. - Check A1c with other labs. - Refill Mounjaro  prescription. - Encourage resistance exercises to maintain muscle mass. - Follow up  in 3 mos  Menstrual Pain Managed with Amethia. Ablation history with return of pain. Birth control reduced pain and flow. No significant weight gain, possible water retention. Discussed estrogen benefits for weight and mood management. - Continue birth control pills (Amethia). - Increase water intake to manage potential water retention.  Night Sweats Severe night sweats, possibly perimenopausal, started after birth control initiation. Discussed non-hormonal treatments and herbal supplements. Black cohosh and Veozah suggested. - Discuss night sweats with gynecologist. - Consider trying Black Cohosh at bedtime, start with 1 capsule, then increase to recommended dose if tolerated. Look for organic or with a Seal of authentication and nothing else added to the pill.  - Consider Veozah for vasomotor symptoms if needed, discuss with GYN - Will continue to follow  Migraine Prophylaxis Managed with nightly metoprolol  25mg  qhs, effective for headaches. Additional medication used for acute headaches. - Continue metoprolol  at bedtime, sending refill. - F/U in 6 mo  General Health Maintenance Routine labs due for cholesterol, metabolic panel, liver, and kidney function. Urine test needed for proteinuria check related to diabetes management. - Order cholesterol, metabolic panel, liver, and kidney function tests. - Order urine test to check for proteinuria. - Results to be reviewed on MyChart in a few days.   Subjective:    Outpatient Medications Prior to Visit  Medication Sig Dispense Refill   acetaminophen  (TYLENOL ) 500 MG tablet Take 1,000 mg by mouth as needed for headache.     Cetirizine HCl (ZYRTEC ALLERGY) 10 MG CAPS 10 mg daily.     cyclobenzaprine  (FLEXERIL ) 5 MG tablet TAKE 1-2 TABLETS (5-10  MG TOTAL) BY MOUTH 3 (THREE) TIMES DAILY AS NEEDED FOR MUSCLE SPASMS (MIGRAINE). TAKE AT FIRST SIGN OF MIGRAINE WITH SUMATRIPTAN  30 tablet 2   Levonorgestrel-Ethinyl Estradiol (AMETHIA) 0.15-0.03  &0.01 MG tablet Take 1 tablet by mouth at bedtime. 84 tablet 1   metoprolol  succinate (TOPROL -XL) 25 MG 24 hr tablet TAKE 1/2 TABLET AT BEDTIME FOR 2 WEEKS, THEN MAY INCREASE TO 1 TAB IF TOLERATED TO PREVENT MIGRAINES 90 tablet 0   rosuvastatin  (CRESTOR ) 5 MG tablet TAKE 1 TABLET 3 DAYS A WEEK (MONDAY, WEDNESDAY AND FRIDAY) FOR 14 DAYS THEN TAKE TAKE 1 TABLET BY MOUTH EVERY OTHER DAY FOR 14 DAYS THEN TAKE 1 TABLET BY MOUTH DAILY 90 tablet 1   SUMAtriptan  (IMITREX ) 50 MG tablet TAKE 1-2 TABLETS (50-100 MG TOTAL) BY MOUTH AS DIRECTED. TAKE FIRST DOSE WITH 1-2 FLEXERIL  TABS. MAY REPEAT IN 2 HOURS IF HEADACHE PERSISTS OR RECURS. MAX DOSE 4 PILLS IN 24 HOURS. 12 tablet 7   tirzepatide  (MOUNJARO ) 15 MG/0.5ML Pen INJECT 15 MG INTO THE SKIN ONCE A WEEK. 6 mL 0   No facility-administered medications prior to visit.   Past Medical History:  Diagnosis Date   Abnormal Pap smear    LAST PAPP 04/2011   Allergic rhinitis    Anemia    CHRONIC   Breast mass 07/26/2016   rt lump near lt axilla   Diabetes mellitus without complication (HCC)    Fracture of arm CHILDHOOD   Fracture of wrist CHILDHOOD   Gestational diabetes    glyburide   Hx of PTL (preterm labor), current pregnancy 04/29/2012   2nd pg PTL at 32 week del at 38 weeks   PONV (postoperative nausea and vomiting)    Pregnant state, incidental 04/28/2012   Preterm labor 2008   Vaginal delivery 10/27/2012   Past Surgical History:  Procedure Laterality Date   ABLATION     DILATION AND CURETTAGE OF UTERUS  04/26/2009   LAPAROSCOPIC TUBAL LIGATION Bilateral 12/01/2012   Procedure: LAPAROSCOPIC TUBAL LIGATION;  Surgeon: Rome Rigg, MD;  Location: [facility];  Service: Gynecology;  Laterality: Bilateral;   Allergies  Allergen Reactions   Ibuprofen  Hives      Objective:    Physical Exam Vitals and nursing note reviewed.  Constitutional:      Appearance: Normal appearance.  Cardiovascular:     Rate and Rhythm: Normal rate and regular rhythm.   Pulmonary:     Effort: Pulmonary effort is normal.     Breath sounds: Normal breath sounds.  Musculoskeletal:        General: Normal range of motion.  Skin:    General: Skin is warm and dry.  Neurological:     Mental Status: She is alert.  Psychiatric:        Mood and Affect: Mood normal.        Behavior: Behavior normal.    BP 112/76 (BP Location: Left Arm, Patient Position: Sitting, Cuff Size: Large)   Pulse 81   Temp 97.6 F (36.4 C) (Temporal)   Ht 5' 6 (1.676 m)   Wt 184 lb 6.4 oz (83.6 kg)   LMP  (LMP Unknown)   SpO2 99%   BMI 29.76 kg/m  Wt Readings from Last 3 Encounters:  11/09/23 184 lb 6.4 oz (83.6 kg)  07/14/23 189 lb 1.6 oz (85.8 kg)  05/30/23 185 lb 8 oz (84.1 kg)      Lucius Krabbe, NP

## 2023-11-09 NOTE — Assessment & Plan Note (Signed)
 Improvement noted with nightly full dose of Metoprolol  25mg  qhs.  Imitrex  effective at first sign of headache. -Continue Metoprolol  nightly, sending refill. -Use Imitrex  and/or Flexeril  at first sign of headache prn. -F/U 6mos

## 2023-11-09 NOTE — Assessment & Plan Note (Signed)
 A1c likely improved due to weight loss. Insurance coverage ongoing due to diabetes diagnosis. Highest dose of Mounjaro  causing appetite suppression but no significant weight loss. Also does increase caused nausea and heartburn, resolved after two months. Discussed maintaining muscle mass during weight loss and potential for continued insurance coverage despite normal A1c. - Continue Mounjaro  at 15 mg. - Check A1c with other labs. - Refill Mounjaro  prescription. - Encourage resistance exercises to maintain muscle mass. - Follow up in 3 mos

## 2023-11-10 ENCOUNTER — Ambulatory Visit: Payer: Self-pay | Admitting: Family

## 2023-12-04 ENCOUNTER — Other Ambulatory Visit: Payer: Self-pay | Admitting: Family

## 2023-12-04 DIAGNOSIS — E782 Mixed hyperlipidemia: Secondary | ICD-10-CM

## 2024-02-04 ENCOUNTER — Other Ambulatory Visit: Payer: Self-pay | Admitting: Obstetrics and Gynecology

## 2024-02-04 DIAGNOSIS — Z01419 Encounter for gynecological examination (general) (routine) without abnormal findings: Secondary | ICD-10-CM

## 2024-02-04 DIAGNOSIS — Z30011 Encounter for initial prescription of contraceptive pills: Secondary | ICD-10-CM

## 2024-02-10 ENCOUNTER — Ambulatory Visit: Admitting: Family

## 2024-02-16 ENCOUNTER — Ambulatory Visit: Admitting: Family

## 2024-02-17 ENCOUNTER — Ambulatory Visit: Admitting: Family

## 2024-02-17 DIAGNOSIS — Z23 Encounter for immunization: Secondary | ICD-10-CM

## 2024-02-17 DIAGNOSIS — R12 Heartburn: Secondary | ICD-10-CM | POA: Diagnosis not present

## 2024-02-17 DIAGNOSIS — E1169 Type 2 diabetes mellitus with other specified complication: Secondary | ICD-10-CM

## 2024-02-17 MED ORDER — MOUNJARO 15 MG/0.5ML ~~LOC~~ SOAJ: 15.0000 mg | SUBCUTANEOUS | 0 refills | Status: DC

## 2024-02-17 NOTE — Progress Notes (Signed)
 Patient ID: Amber Morales, female    DOB: [birth year], 38 y.o.   MRN: [id number]  Chief Complaint  Patient presents with   Type 2 diabetes mellitus with morbid obesity  Discussed the use of AI scribe software for clinical note transcription with the patient, who gave verbal consent to proceed.  History of Present Illness Amber Morales is a 38 year old female who presents for follow-up for T2DM and medication management. She is accompanied by her daughter.  Her daughter was involved in a car accident resulting in a finger fracture. Her brother sustained facial injuries. No one required hospitalization. She was not in the car, but understandably, very shaken up. She is experiencing ongoing weight loss and is feeling positive about her progress on Mounjaro . She has been on her current weight management medication dose of 15mg  for over nine months and is close to her goal weight of 160 pounds. She is needing smaller size clothes and is pleased with her progress.  She experiences heartburn, which can be severe at times. She takes over-the-counter Nexium at the lowest dose, once a day, either in the morning or at night, depending on when symptoms are worse.  She takes metoprolol  daily for migraine prevention. Her migraines are weather-dependent. She experienced a headache this morning due to a temperature drop but took sumatriptan , which resolved the headache. She takes metoprolol  at night.  Assessment & Plan T2DM She is experiencing weight loss and is close to her goal weight of 160 lbs. The current medication dosage has been effective for weight management. - Continue current medication dosage until goal weight is achieved. - Monitor weight and adjust medication dosage as needed. - Gradually reduce medication dosage once goal weight is achieved. - Consider discontinuing medication if weight is maintained without it.  Migraine She experiences migraines that are weather-dependent and she is on  Metoprolol  25mg  qhs for prevention & Imitrex  for acute pain which are working well. Her blood pressure is on the low end, likely due to weight loss, concern for continued decrease in BP. - Reduce metoprolol  dosage to half = 12.5mg  and monitor for migraine frequency. - Continue to use sumatriptan  for acute migraine relief as needed. - Monitor blood pressure and heart rate. - Reassess if migraines increase after dosage reduction and let me know.  Gastroesophageal reflux disease (GERD) She experiences heartburn due to the Mounjaro  and uses over-the-counter Nexium (esomeprazole) for relief. Reassured safe to take daily for now. - Ok to continue using low-dose over-the-counter Nexium daily for heartburn relief. - Reduce acid in diet especially tomatoes, salsa, juices, juicy fruits & caffeine.   Subjective:    Outpatient Medications Prior to Visit  Medication Sig Dispense Refill   acetaminophen  (TYLENOL ) 500 MG tablet Take 1,000 mg by mouth as needed for headache.     Cetirizine HCl (ZYRTEC ALLERGY) 10 MG CAPS 10 mg daily.     cyclobenzaprine  (FLEXERIL ) 5 MG tablet TAKE 1-2 TABLETS (5-10 MG TOTAL) BY MOUTH 3 (THREE) TIMES DAILY AS NEEDED FOR MUSCLE SPASMS (MIGRAINE). TAKE AT FIRST SIGN OF MIGRAINE WITH SUMATRIPTAN  30 tablet 2   Levonorgestrel-Ethinyl Estradiol (JAIMIESS) 0.15-0.03 &0.01 MG tablet TAKE 1 TABLET BY MOUTH AT BEDTIME 91 tablet 1   metoprolol  succinate (TOPROL -XL) 25 MG 24 hr tablet Take 1 tablet (25 mg total) by mouth daily. 90 tablet 1   rosuvastatin  (CRESTOR ) 5 MG tablet TAKE 1 TABLET ORALLY MONDAY,WEDNESDAY,FRIDAY FOR 2WEEKS THEN 1 TAB EVERY OTHER DAY FOR 2 WEEKS,THEN 1 TAB DAILY  90 tablet 1   SUMAtriptan  (IMITREX ) 50 MG tablet TAKE 1-2 TABLETS (50-100 MG TOTAL) BY MOUTH AS DIRECTED. TAKE FIRST DOSE WITH 1-2 FLEXERIL  TABS. MAY REPEAT IN 2 HOURS IF HEADACHE PERSISTS OR RECURS. MAX DOSE 4 PILLS IN 24 HOURS. 12 tablet 7   tirzepatide  (MOUNJARO ) 15 MG/0.5ML Pen Inject 15 mg into the skin  once a week. 6 mL 0   No facility-administered medications prior to visit.   Past Medical History:  Diagnosis Date   Abnormal Pap smear    LAST PAPP 04/2011   Allergic rhinitis    Anemia    CHRONIC   Breast mass 07/26/2016   rt lump near lt axilla   Diabetes mellitus without complication (HCC)    Fracture of arm CHILDHOOD   Fracture of wrist CHILDHOOD   Gestational diabetes    glyburide   Hx of PTL (preterm labor), current pregnancy 04/29/2012   2nd pg PTL at 32 week del at 38 weeks   PONV (postoperative nausea and vomiting)    Pregnant state, incidental 04/28/2012   Preterm labor 2008   Vaginal delivery 10/27/2012   Past Surgical History:  Procedure Laterality Date   ABLATION     DILATION AND CURETTAGE OF UTERUS  04/26/2009   LAPAROSCOPIC TUBAL LIGATION Bilateral 12/01/2012   Procedure: LAPAROSCOPIC TUBAL LIGATION;  Surgeon: Rome Rigg, MD;  Location: [facility];  Service: Gynecology;  Laterality: Bilateral;   Allergies  Allergen Reactions   Ibuprofen  Hives      Objective:    Physical Exam Vitals and nursing note reviewed.  Constitutional:      Appearance: Normal appearance.  Cardiovascular:     Rate and Rhythm: Normal rate and regular rhythm.  Pulmonary:     Effort: Pulmonary effort is normal.     Breath sounds: Normal breath sounds.  Musculoskeletal:        General: Normal range of motion.  Skin:    General: Skin is warm and dry.  Neurological:     Mental Status: She is alert.  Psychiatric:        Mood and Affect: Mood normal.        Behavior: Behavior normal.    BP (!) 108/58 (BP Location: Left Arm, Patient Position: Sitting, Cuff Size: Normal)   Pulse 90   Temp 97.8 F (36.6 C) (Temporal)   Ht 5' 6 (1.676 m)   Wt 171 lb 9.6 oz (77.8 kg)   LMP 02/08/2024 (Approximate)   SpO2 98%   BMI 27.70 kg/m  Wt Readings from Last 3 Encounters:  02/17/24 171 lb 9.6 oz (77.8 kg)  11/09/23 184 lb 6.4 oz (83.6 kg)  07/14/23 189 lb 1.6 oz (85.8 kg)     Lucius Krabbe, NP

## 2024-03-28 ENCOUNTER — Encounter: Payer: Self-pay | Admitting: Family

## 2024-03-28 ENCOUNTER — Ambulatory Visit: Admitting: Family

## 2024-03-28 VITALS — BP 118/80 | HR 93 | Temp 98.1°F | Ht 66.0 in | Wt 173.1 lb

## 2024-03-28 DIAGNOSIS — J0110 Acute frontal sinusitis, unspecified: Secondary | ICD-10-CM

## 2024-03-28 MED ORDER — AMOXICILLIN-POT CLAVULANATE 875-125 MG PO TABS: 1.0000 | ORAL_TABLET | Freq: Two times a day (BID) | ORAL | 0 refills | Status: DC

## 2024-03-28 NOTE — Progress Notes (Signed)
 Patient ID: Amber Morales, female    DOB: 01-21-1986, 38 y.o.   MRN: [id number]  Chief Complaint  Patient presents with   Cough    Pt c/o cough, sinus pressure and post nasal drip. Present for 1 month. Has tried zyrtec, nyquil, vick vapor rub and diffuser, which did help slightly.   Discussed the use of AI scribe software for clinical note transcription with the patient, who gave verbal consent to proceed.  History of Present Illness Amber Morales is a 38 year old female who presents with persistent sinus congestion and cough following a cold.  She has had sinus congestion and cough for several weeks after her husband was sick with a cold. Congestion is associated with sinus pressure and nasal drainage that varies from brown to yellow to white. Bending her head down does not cause pain, but she feels congestion when breathing through her nose and has frontal headache pain.  She has been taking Zyrtec D daily, which helps with decongestion, and using Vicks and a eucalyptus diffuser to aid drainage.  She has a scratchy throat without significant pain. At times her ears hurt when congestion is worse. The cough is mostly dry with occasional small amounts of mucus and sometimes wakes her at night. She does not use a humidifier.  Assessment & Plan Acute sinusitis Symptoms suggestive of bacterial sinusitis due to prolonged duration. - Prescribed Augmentin twice daily for 5 days. - Advised to eat before taking Augmentin. - Continue Zyrtec D once daily, monitor blood pressure and heart rate. - Recommended saline nasal spray several times per day for disinfecting and moisture. - Advised increased water intake. - Suggested using a humidifier overnight. - Instructed to report if symptoms persist post-antibiotic.    Subjective:    Outpatient Medications Prior to Visit  Medication Sig Dispense Refill   acetaminophen  (TYLENOL ) 500 MG tablet Take 1,000 mg by mouth as needed for headache.      Cetirizine HCl (ZYRTEC ALLERGY) 10 MG CAPS 10 mg daily.     cyclobenzaprine  (FLEXERIL ) 5 MG tablet TAKE 1-2 TABLETS (5-10 MG TOTAL) BY MOUTH 3 (THREE) TIMES DAILY AS NEEDED FOR MUSCLE SPASMS (MIGRAINE). TAKE AT FIRST SIGN OF MIGRAINE WITH SUMATRIPTAN  30 tablet 2   Levonorgestrel-Ethinyl Estradiol (JAIMIESS) 0.15-0.03 &0.01 MG tablet TAKE 1 TABLET BY MOUTH AT BEDTIME 91 tablet 1   metoprolol  succinate (TOPROL -XL) 25 MG 24 hr tablet Take 1 tablet (25 mg total) by mouth daily. 90 tablet 1   rosuvastatin  (CRESTOR ) 5 MG tablet TAKE 1 TABLET ORALLY MONDAY,WEDNESDAY,FRIDAY FOR 2WEEKS THEN 1 TAB EVERY OTHER DAY FOR 2 WEEKS,THEN 1 TAB DAILY 90 tablet 1   SUMAtriptan  (IMITREX ) 50 MG tablet TAKE 1-2 TABLETS (50-100 MG TOTAL) BY MOUTH AS DIRECTED. TAKE FIRST DOSE WITH 1-2 FLEXERIL  TABS. MAY REPEAT IN 2 HOURS IF HEADACHE PERSISTS OR RECURS. MAX DOSE 4 PILLS IN 24 HOURS. 12 tablet 7   tirzepatide  (MOUNJARO ) 15 MG/0.5ML Pen Inject 15 mg into the skin once a week. 6 mL 0   No facility-administered medications prior to visit.   Past Medical History:  Diagnosis Date   Abnormal Pap smear    LAST PAPP 04/2011   Allergic rhinitis    Anemia    CHRONIC   Breast mass 07/26/2016   rt lump near lt axilla   Diabetes mellitus without complication (HCC)    Fracture of arm CHILDHOOD   Fracture of wrist CHILDHOOD   Gestational diabetes    glyburide   Hx  of PTL (preterm labor), current pregnancy 04/29/2012   2nd pg PTL at 32 week del at 38 weeks   PONV (postoperative nausea and vomiting)    Pregnant state, incidental 04/28/2012   Preterm labor 2008   Vaginal delivery 10/27/2012   Past Surgical History:  Procedure Laterality Date   ABLATION     DILATION AND CURETTAGE OF UTERUS  04/26/2009   LAPAROSCOPIC TUBAL LIGATION Bilateral 12/01/2012   Procedure: LAPAROSCOPIC TUBAL LIGATION;  Surgeon: Rome Rigg, MD;  Location: [facility];  Service: Gynecology;  Laterality: Bilateral;   Allergies  Allergen Reactions    Ibuprofen  Hives      Objective:    Physical Exam Vitals and nursing note reviewed.  Constitutional:      Appearance: Normal appearance. She is ill-appearing.     Interventions: Face mask in place.  HENT:     Right Ear: Tympanic membrane and ear canal normal.     Left Ear: Tympanic membrane and ear canal normal.     Nose:     Right Sinus: Frontal sinus tenderness present.     Left Sinus: Frontal sinus tenderness present.     Mouth/Throat:     Mouth: Mucous membranes are moist.     Pharynx: Posterior oropharyngeal erythema present. No pharyngeal swelling, oropharyngeal exudate or uvula swelling.     Tonsils: No tonsillar exudate or tonsillar abscesses.  Cardiovascular:     Rate and Rhythm: Normal rate and regular rhythm.  Pulmonary:     Effort: Pulmonary effort is normal.     Breath sounds: Normal breath sounds.  Musculoskeletal:        General: Normal range of motion.  Lymphadenopathy:     Head:     Right side of head: No preauricular or posterior auricular adenopathy.     Left side of head: No preauricular or posterior auricular adenopathy.     Cervical: No cervical adenopathy.  Skin:    General: Skin is warm and dry.  Neurological:     Mental Status: She is alert.  Psychiatric:        Mood and Affect: Mood normal.        Behavior: Behavior normal.    BP 118/80 (BP Location: Left Arm, Patient Position: Sitting, Cuff Size: Normal)   Pulse 93   Temp 98.1 F (36.7 C) (Temporal)   Ht 5' 6 (1.676 m)   Wt 173 lb 2 oz (78.5 kg)   SpO2 99%   BMI 27.94 kg/m  Wt Readings from Last 3 Encounters:  03/28/24 173 lb 2 oz (78.5 kg)  02/17/24 171 lb 9.6 oz (77.8 kg)  11/09/23 184 lb 6.4 oz (83.6 kg)      Lucius Krabbe, NP

## 2024-04-02 ENCOUNTER — Ambulatory Visit: Admitting: Family

## 2024-05-11 ENCOUNTER — Other Ambulatory Visit: Payer: Self-pay | Admitting: Family

## 2024-05-11 DIAGNOSIS — G43009 Migraine without aura, not intractable, without status migrainosus: Secondary | ICD-10-CM

## 2024-05-24 NOTE — Progress Notes (Unsigned)
 "  Patient ID: Amber Morales, female    DOB: [birth year], 39 y.o.   MRN: [id number]  No chief complaint on file.  Subjective:    Outpatient Medications Prior to Visit  Medication Sig Dispense Refill   acetaminophen  (TYLENOL ) 500 MG tablet Take 1,000 mg by mouth as needed for headache.     amoxicillin -clavulanate (AUGMENTIN ) 875-125 MG tablet Take 1 tablet by mouth 2 (two) times daily after a meal. 10 tablet 0   Cetirizine HCl (ZYRTEC ALLERGY) 10 MG CAPS 10 mg daily.     cyclobenzaprine  (FLEXERIL ) 5 MG tablet TAKE 1-2 TABLETS (5-10 MG TOTAL) BY MOUTH 3 (THREE) TIMES DAILY AS NEEDED FOR MUSCLE SPASMS (MIGRAINE). TAKE AT FIRST SIGN OF MIGRAINE WITH SUMATRIPTAN  30 tablet 2   Levonorgestrel-Ethinyl Estradiol (JAIMIESS) 0.15-0.03 &0.01 MG tablet TAKE 1 TABLET BY MOUTH AT BEDTIME 91 tablet 1   metoprolol  succinate (TOPROL -XL) 25 MG 24 hr tablet TAKE 1 TABLET (25 MG TOTAL) BY MOUTH DAILY. 90 tablet 1   rosuvastatin  (CRESTOR ) 5 MG tablet TAKE 1 TABLET ORALLY MONDAY,WEDNESDAY,FRIDAY FOR 2WEEKS THEN 1 TAB EVERY OTHER DAY FOR 2 WEEKS,THEN 1 TAB DAILY 90 tablet 1   SUMAtriptan  (IMITREX ) 50 MG tablet TAKE 1-2 TABLETS (50-100 MG TOTAL) BY MOUTH AS DIRECTED. TAKE FIRST DOSE WITH 1-2 FLEXERIL  TABS. MAY REPEAT IN 2 HOURS IF HEADACHE PERSISTS OR RECURS. MAX DOSE 4 PILLS IN 24 HOURS. 12 tablet 7   tirzepatide  (MOUNJARO ) 15 MG/0.5ML Pen Inject 15 mg into the skin once a week. 6 mL 0   No facility-administered medications prior to visit.   Past Medical History:  Diagnosis Date   Abnormal Pap smear    LAST PAPP 04/2011   Allergic rhinitis    Anemia    CHRONIC   Breast mass 07/26/2016   rt lump near lt axilla   Diabetes mellitus without complication (HCC)    Fracture of arm CHILDHOOD   Fracture of wrist CHILDHOOD   Gestational diabetes    glyburide   Hx of PTL (preterm labor), current pregnancy 04/29/2012   2nd pg PTL at 32 week del at 38 weeks   PONV (postoperative nausea and vomiting)    Pregnant state,  incidental 04/28/2012   Preterm labor 2008   Vaginal delivery 10/27/2012   Past Surgical History:  Procedure Laterality Date   ABLATION     DILATION AND CURETTAGE OF UTERUS  04/26/2009   LAPAROSCOPIC TUBAL LIGATION Bilateral 12/01/2012   Procedure: LAPAROSCOPIC TUBAL LIGATION;  Surgeon: Rome Rigg, MD;  Location: [facility];  Service: Gynecology;  Laterality: Bilateral;   Allergies[1]    Objective:    Physical Exam Vitals and nursing note reviewed.  Constitutional:      Appearance: Normal appearance.  Cardiovascular:     Rate and Rhythm: Normal rate and regular rhythm.  Pulmonary:     Effort: Pulmonary effort is normal.     Breath sounds: Normal breath sounds.  Musculoskeletal:        General: Normal range of motion.  Skin:    General: Skin is warm and dry.  Neurological:     Mental Status: She is alert.  Psychiatric:        Mood and Affect: Mood normal.        Behavior: Behavior normal.    There were no vitals taken for this visit. Wt Readings from Last 3 Encounters:  03/28/24 173 lb 2 oz (78.5 kg)  02/17/24 171 lb 9.6 oz (77.8 kg)  11/09/23 184 lb 6.4 oz (  83.6 kg)      Lucius Krabbe, NP     [1]  Allergies Allergen Reactions   Ibuprofen  Hives   "

## 2024-05-25 ENCOUNTER — Ambulatory Visit: Admitting: Family

## 2024-05-30 ENCOUNTER — Encounter: Payer: Self-pay | Admitting: Family

## 2024-05-30 ENCOUNTER — Telehealth: Admitting: Family

## 2024-05-30 DIAGNOSIS — E782 Mixed hyperlipidemia: Secondary | ICD-10-CM

## 2024-05-30 DIAGNOSIS — E669 Obesity, unspecified: Secondary | ICD-10-CM

## 2024-05-30 MED ORDER — ROSUVASTATIN CALCIUM 5 MG PO TABS: 5.0000 mg | ORAL_TABLET | Freq: Every day | ORAL | 3 refills | Status: AC

## 2024-05-30 MED ORDER — MOUNJARO 15 MG/0.5ML ~~LOC~~ SOAJ: 15.0000 mg | SUBCUTANEOUS | 1 refills | Status: AC

## 2024-05-30 NOTE — Progress Notes (Signed)
 "   MyChart Video Visit    Virtual Visit via Video Note   This format is felt to be most appropriate for this patient at this time. Physical exam was limited by quality of the video and audio technology used for the visit. CMA was able to get the patient set up on a video visit.  Patient location: Home. Patient and provider in visit Provider location: Office  I discussed the limitations of evaluation and management by telemedicine and the availability of in person appointments. The patient expressed understanding and agreed to proceed.  Visit Date: 05/30/2024  Today's healthcare provider: Lucius Krabbe, NP     Subjective:   Patient ID: Wells ONEIDA Patter, female    DOB: 10-06-1985, 39 y.o.   MRN: [id number]  Chief Complaint  Patient presents with   Type 2 diabetes mellitus with morbid obesity   Migraine without aura and without status migrainosus, not i   Hyperlipidemia  Discussed the use of AI scribe software for clinical note transcription with the patient, who gave verbal consent to proceed.  History of Present Illness TARYNE KIGER is a 39 year old female who presents for follow-up on Mounjaro  treatment.  She is on Mounjaro  15 mg without nausea, constipation, or reflux attributable to the medication. She has chronic constipation managed with regular Miralax, started before Mounjaro . She has occasional reflux controlled with over-the-counter medication. She exercises regularly.  Assessment & Plan Type 2 diabetes mellitus  Diabetes well-controlled, stable weight, no significant side effects from Tirzepatide . Occasional reflux and constipation managed. - Continue Tirzepatide  15 mg subcutaneous weekly. - Sent refill for Tirzepatide . - Continue Miralax for constipation. - Continue OTC medication for reflux as needed. - F/U in 4 mos w/labs  Mixed hyperlipidemia Managed with Rosuvastatin  5mg  qd, denies SE. - Sent refill for Rosuvastatin . - F/U in 4 mos with fasting  labs   Past Medical History:  Diagnosis Date   Abnormal Pap smear    LAST PAPP 04/2011   Allergic rhinitis    Anemia    CHRONIC   Breast mass 07/26/2016   rt lump near lt axilla   Diabetes mellitus without complication (HCC)    Fracture of arm CHILDHOOD   Fracture of wrist CHILDHOOD   Gestational diabetes    glyburide   Hx of PTL (preterm labor), current pregnancy 04/29/2012   2nd pg PTL at 32 week del at 38 weeks   PONV (postoperative nausea and vomiting)    Pregnant state, incidental 04/28/2012   Preterm labor 2008   Vaginal delivery 10/27/2012    Past Surgical History:  Procedure Laterality Date   ABLATION     DILATION AND CURETTAGE OF UTERUS  04/26/2009   LAPAROSCOPIC TUBAL LIGATION Bilateral 12/01/2012   Procedure: LAPAROSCOPIC TUBAL LIGATION;  Surgeon: Rome Rigg, MD;  Location: [facility];  Service: Gynecology;  Laterality: Bilateral;    Outpatient Medications Prior to Visit  Medication Sig Dispense Refill   acetaminophen  (TYLENOL ) 500 MG tablet Take 1,000 mg by mouth as needed for headache.     Cetirizine HCl (ZYRTEC ALLERGY) 10 MG CAPS 10 mg daily.     cyclobenzaprine  (FLEXERIL ) 5 MG tablet TAKE 1-2 TABLETS (5-10 MG TOTAL) BY MOUTH 3 (THREE) TIMES DAILY AS NEEDED FOR MUSCLE SPASMS (MIGRAINE). TAKE AT FIRST SIGN OF MIGRAINE WITH SUMATRIPTAN  30 tablet 2   Levonorgestrel-Ethinyl Estradiol (JAIMIESS) 0.15-0.03 &0.01 MG tablet TAKE 1 TABLET BY MOUTH AT BEDTIME 91 tablet 1   metoprolol  succinate (TOPROL -XL) 25 MG 24 hr tablet  TAKE 1 TABLET (25 MG TOTAL) BY MOUTH DAILY. 90 tablet 1   SUMAtriptan  (IMITREX ) 50 MG tablet TAKE 1-2 TABLETS (50-100 MG TOTAL) BY MOUTH AS DIRECTED. TAKE FIRST DOSE WITH 1-2 FLEXERIL  TABS. MAY REPEAT IN 2 HOURS IF HEADACHE PERSISTS OR RECURS. MAX DOSE 4 PILLS IN 24 HOURS. 12 tablet 7   amoxicillin -clavulanate (AUGMENTIN ) 875-125 MG tablet Take 1 tablet by mouth 2 (two) times daily after a meal. 10 tablet 0   rosuvastatin  (CRESTOR ) 5 MG tablet TAKE 1  TABLET ORALLY MONDAY,WEDNESDAY,FRIDAY FOR 2WEEKS THEN 1 TAB EVERY OTHER DAY FOR 2 WEEKS,THEN 1 TAB DAILY 90 tablet 1   tirzepatide  (MOUNJARO ) 15 MG/0.5ML Pen Inject 15 mg into the skin once a week. 6 mL 0   No facility-administered medications prior to visit.   Allergies[1]    Objective:   Physical Exam Vitals and nursing note reviewed.  Constitutional:      General: Pt is not in acute distress.    Appearance: Normal appearance.  HENT:     Head: Normocephalic.  Pulmonary:     Effort: No respiratory distress.  Musculoskeletal:     Cervical back: Normal range of motion.  Skin:    General: Skin is dry.     Coloration: Skin is not pale.  Neurological:     Mental Status: Pt is alert and oriented to person, place, and time.  Psychiatric:        Mood and Affect: Mood normal.   Ht 5' 6 (1.676 m)   Wt 176 lb 12.8 oz (80.2 kg)   BMI 28.54 kg/m   Wt Readings from Last 3 Encounters:  05/30/24 176 lb 12.8 oz (80.2 kg)  03/28/24 173 lb 2 oz (78.5 kg)  02/17/24 171 lb 9.6 oz (77.8 kg)      I discussed the assessment and treatment plan with the patient. The patient was provided an opportunity to ask questions and all were answered. The patient agreed with the plan and demonstrated an understanding of the instructions.   The patient was advised to call back or seek an in-person evaluation if the symptoms worsen or if the condition fails to improve as anticipated.  Lucius Krabbe, NP [facility] at [facility] [phone] (phone) [phone] (fax)  [facility]     [1]  Allergies Allergen Reactions   Ibuprofen  Hives   "

## 2024-09-28 ENCOUNTER — Encounter: Admitting: Family
